# Patient Record
Sex: Male | Born: 1935 | Race: White | Hispanic: No | Marital: Married | Smoking: Former smoker
Health system: Southern US, Community
[De-identification: ages and names within clinical notes are randomized; demographics above are authoritative.]

## PROBLEM LIST (undated history)

## (undated) DIAGNOSIS — R413 Other amnesia: Secondary | ICD-10-CM

## (undated) DIAGNOSIS — I2510 Atherosclerotic heart disease of native coronary artery without angina pectoris: Secondary | ICD-10-CM

## (undated) DIAGNOSIS — I10 Essential (primary) hypertension: Secondary | ICD-10-CM

## (undated) DIAGNOSIS — D126 Benign neoplasm of colon, unspecified: Secondary | ICD-10-CM

## (undated) DIAGNOSIS — Z860100 Personal history of colon polyps, unspecified: Secondary | ICD-10-CM

## (undated) DIAGNOSIS — Z8546 Personal history of malignant neoplasm of prostate: Secondary | ICD-10-CM

## (undated) DIAGNOSIS — I6529 Occlusion and stenosis of unspecified carotid artery: Secondary | ICD-10-CM

## (undated) DIAGNOSIS — M1990 Unspecified osteoarthritis, unspecified site: Secondary | ICD-10-CM

## (undated) DIAGNOSIS — I739 Peripheral vascular disease, unspecified: Secondary | ICD-10-CM

## (undated) DIAGNOSIS — E119 Type 2 diabetes mellitus without complications: Secondary | ICD-10-CM

## (undated) DIAGNOSIS — E785 Hyperlipidemia, unspecified: Secondary | ICD-10-CM

## (undated) HISTORY — DX: Hyperlipidemia, unspecified: E78.5

## (undated) HISTORY — DX: Personal history of colon polyps, unspecified: Z86.0100

## (undated) HISTORY — DX: Other amnesia: R41.3

## (undated) HISTORY — DX: Personal history of malignant neoplasm of prostate: Z85.46

## (undated) HISTORY — DX: Peripheral vascular disease, unspecified: I73.9

## (undated) HISTORY — DX: Benign neoplasm of colon, unspecified: D12.6

## (undated) HISTORY — DX: Personal history of colonic polyps: Z86.010

## (undated) HISTORY — DX: Essential (primary) hypertension: I10

## (undated) HISTORY — DX: Occlusion and stenosis of unspecified carotid artery: I65.29

## (undated) HISTORY — DX: Type 2 diabetes mellitus without complications: E11.9

## (undated) HISTORY — DX: Unspecified osteoarthritis, unspecified site: M19.90

## (undated) HISTORY — DX: Atherosclerotic heart disease of native coronary artery without angina pectoris: I25.10

---

## 1998-04-09 ENCOUNTER — Encounter: Admission: RE | Admit: 1998-04-09 | Discharge: 1998-04-09 | Payer: Self-pay | Admitting: Family Medicine

## 1998-05-01 ENCOUNTER — Encounter: Admission: RE | Admit: 1998-05-01 | Discharge: 1998-05-01 | Payer: Self-pay | Admitting: Sports Medicine

## 1998-05-08 ENCOUNTER — Encounter: Admission: RE | Admit: 1998-05-08 | Discharge: 1998-05-08 | Payer: Self-pay | Admitting: Sports Medicine

## 1998-05-26 HISTORY — PX: CORONARY ARTERY BYPASS GRAFT: SHX141

## 1998-07-06 ENCOUNTER — Encounter: Admission: RE | Admit: 1998-07-06 | Discharge: 1998-07-06 | Payer: Self-pay | Admitting: Family Medicine

## 1998-08-21 ENCOUNTER — Encounter: Admission: RE | Admit: 1998-08-21 | Discharge: 1998-08-21 | Payer: Self-pay | Admitting: Family Medicine

## 1998-08-28 ENCOUNTER — Encounter: Admission: RE | Admit: 1998-08-28 | Discharge: 1998-08-28 | Payer: Self-pay | Admitting: Family Medicine

## 1998-10-12 ENCOUNTER — Encounter: Admission: RE | Admit: 1998-10-12 | Discharge: 1998-10-12 | Payer: Self-pay | Admitting: Family Medicine

## 1998-10-29 ENCOUNTER — Ambulatory Visit (HOSPITAL_COMMUNITY): Admission: RE | Admit: 1998-10-29 | Discharge: 1998-10-29 | Payer: Self-pay | Admitting: Family Medicine

## 1998-11-07 ENCOUNTER — Encounter: Payer: Self-pay | Admitting: *Deleted

## 1998-11-07 ENCOUNTER — Inpatient Hospital Stay (HOSPITAL_COMMUNITY): Admission: AD | Admit: 1998-11-07 | Discharge: 1998-11-13 | Payer: Self-pay | Admitting: *Deleted

## 1998-11-09 ENCOUNTER — Encounter: Payer: Self-pay | Admitting: Surgery

## 1998-11-10 ENCOUNTER — Encounter: Payer: Self-pay | Admitting: Surgery

## 1998-11-11 ENCOUNTER — Encounter: Payer: Self-pay | Admitting: Surgery

## 1998-11-14 ENCOUNTER — Encounter: Admission: RE | Admit: 1998-11-14 | Discharge: 1998-11-14 | Payer: Self-pay | Admitting: Family Medicine

## 1998-12-13 ENCOUNTER — Encounter: Admission: RE | Admit: 1998-12-13 | Discharge: 1998-12-13 | Payer: Self-pay | Admitting: Family Medicine

## 1998-12-25 ENCOUNTER — Encounter (HOSPITAL_COMMUNITY): Admission: RE | Admit: 1998-12-25 | Discharge: 1999-03-25 | Payer: Self-pay | Admitting: *Deleted

## 1999-01-23 ENCOUNTER — Encounter: Admission: RE | Admit: 1999-01-23 | Discharge: 1999-01-23 | Payer: Self-pay | Admitting: Family Medicine

## 1999-02-14 ENCOUNTER — Encounter: Admission: RE | Admit: 1999-02-14 | Discharge: 1999-02-14 | Payer: Self-pay | Admitting: Family Medicine

## 1999-03-22 ENCOUNTER — Encounter: Admission: RE | Admit: 1999-03-22 | Discharge: 1999-03-22 | Payer: Self-pay | Admitting: Sports Medicine

## 1999-06-17 ENCOUNTER — Encounter: Admission: RE | Admit: 1999-06-17 | Discharge: 1999-06-17 | Payer: Self-pay | Admitting: Family Medicine

## 1999-11-26 ENCOUNTER — Encounter: Admission: RE | Admit: 1999-11-26 | Discharge: 1999-11-26 | Payer: Self-pay | Admitting: Family Medicine

## 2000-01-14 ENCOUNTER — Encounter: Admission: RE | Admit: 2000-01-14 | Discharge: 2000-01-14 | Payer: Self-pay | Admitting: Sports Medicine

## 2000-01-21 ENCOUNTER — Encounter: Admission: RE | Admit: 2000-01-21 | Discharge: 2000-01-21 | Payer: Self-pay | Admitting: Sports Medicine

## 2000-03-24 ENCOUNTER — Encounter: Admission: RE | Admit: 2000-03-24 | Discharge: 2000-03-24 | Payer: Self-pay | Admitting: Family Medicine

## 2000-03-25 ENCOUNTER — Encounter: Admission: RE | Admit: 2000-03-25 | Discharge: 2000-03-25 | Payer: Self-pay | Admitting: Family Medicine

## 2000-04-14 ENCOUNTER — Encounter: Admission: RE | Admit: 2000-04-14 | Discharge: 2000-04-14 | Payer: Self-pay | Admitting: Family Medicine

## 2000-05-06 ENCOUNTER — Encounter: Admission: RE | Admit: 2000-05-06 | Discharge: 2000-05-06 | Payer: Self-pay | Admitting: Family Medicine

## 2000-06-02 ENCOUNTER — Encounter: Admission: RE | Admit: 2000-06-02 | Discharge: 2000-06-02 | Payer: Self-pay | Admitting: Family Medicine

## 2000-07-06 ENCOUNTER — Encounter: Admission: RE | Admit: 2000-07-06 | Discharge: 2000-07-06 | Payer: Self-pay | Admitting: Family Medicine

## 2000-10-05 ENCOUNTER — Encounter: Admission: RE | Admit: 2000-10-05 | Discharge: 2000-10-05 | Payer: Self-pay | Admitting: Family Medicine

## 2000-11-20 ENCOUNTER — Ambulatory Visit (HOSPITAL_COMMUNITY): Admission: RE | Admit: 2000-11-20 | Discharge: 2000-11-20 | Payer: Self-pay | Admitting: Neurology

## 2000-12-03 ENCOUNTER — Ambulatory Visit (HOSPITAL_COMMUNITY): Admission: RE | Admit: 2000-12-03 | Discharge: 2000-12-03 | Payer: Self-pay | Admitting: Neurology

## 2001-09-16 ENCOUNTER — Encounter: Admission: RE | Admit: 2001-09-16 | Discharge: 2001-09-16 | Payer: Self-pay | Admitting: Sports Medicine

## 2001-10-22 ENCOUNTER — Encounter: Admission: RE | Admit: 2001-10-22 | Discharge: 2001-10-22 | Payer: Self-pay | Admitting: Family Medicine

## 2002-03-04 ENCOUNTER — Encounter: Admission: RE | Admit: 2002-03-04 | Discharge: 2002-03-04 | Payer: Self-pay | Admitting: Family Medicine

## 2002-06-01 ENCOUNTER — Encounter: Admission: RE | Admit: 2002-06-01 | Discharge: 2002-06-01 | Payer: Self-pay | Admitting: Family Medicine

## 2002-07-05 ENCOUNTER — Encounter: Admission: RE | Admit: 2002-07-05 | Discharge: 2002-07-05 | Payer: Self-pay | Admitting: Family Medicine

## 2002-12-29 ENCOUNTER — Encounter: Admission: RE | Admit: 2002-12-29 | Discharge: 2002-12-29 | Payer: Self-pay | Admitting: Family Medicine

## 2003-01-25 ENCOUNTER — Encounter: Admission: RE | Admit: 2003-01-25 | Discharge: 2003-01-25 | Payer: Self-pay | Admitting: Family Medicine

## 2003-02-08 ENCOUNTER — Encounter: Admission: RE | Admit: 2003-02-08 | Discharge: 2003-02-08 | Payer: Self-pay | Admitting: Family Medicine

## 2003-03-17 ENCOUNTER — Encounter: Admission: RE | Admit: 2003-03-17 | Discharge: 2003-03-17 | Payer: Self-pay | Admitting: Sports Medicine

## 2003-04-14 ENCOUNTER — Encounter: Admission: RE | Admit: 2003-04-14 | Discharge: 2003-04-14 | Payer: Self-pay | Admitting: Family Medicine

## 2003-05-17 ENCOUNTER — Encounter: Admission: RE | Admit: 2003-05-17 | Discharge: 2003-05-17 | Payer: Self-pay | Admitting: Sports Medicine

## 2003-05-29 ENCOUNTER — Ambulatory Visit (HOSPITAL_COMMUNITY): Admission: RE | Admit: 2003-05-29 | Discharge: 2003-05-29 | Payer: Self-pay | Admitting: Family Medicine

## 2003-05-31 ENCOUNTER — Encounter: Admission: RE | Admit: 2003-05-31 | Discharge: 2003-05-31 | Payer: Self-pay | Admitting: Family Medicine

## 2003-10-06 ENCOUNTER — Encounter: Admission: RE | Admit: 2003-10-06 | Discharge: 2003-10-06 | Payer: Self-pay | Admitting: Family Medicine

## 2003-10-06 ENCOUNTER — Ambulatory Visit (HOSPITAL_COMMUNITY): Admission: RE | Admit: 2003-10-06 | Discharge: 2003-10-06 | Payer: Self-pay | Admitting: Family Medicine

## 2003-10-12 ENCOUNTER — Encounter: Admission: RE | Admit: 2003-10-12 | Discharge: 2003-10-12 | Payer: Self-pay | Admitting: Sports Medicine

## 2003-11-20 ENCOUNTER — Encounter: Admission: RE | Admit: 2003-11-20 | Discharge: 2003-11-20 | Payer: Self-pay | Admitting: Family Medicine

## 2004-06-05 ENCOUNTER — Ambulatory Visit: Payer: Self-pay | Admitting: Family Medicine

## 2004-10-28 ENCOUNTER — Ambulatory Visit: Payer: Self-pay | Admitting: *Deleted

## 2004-11-29 ENCOUNTER — Ambulatory Visit: Payer: Self-pay | Admitting: Family Medicine

## 2004-12-03 ENCOUNTER — Encounter: Admission: RE | Admit: 2004-12-03 | Discharge: 2004-12-03 | Payer: Self-pay | Admitting: Family Medicine

## 2005-03-12 ENCOUNTER — Ambulatory Visit: Payer: Self-pay | Admitting: Family Medicine

## 2005-05-27 ENCOUNTER — Ambulatory Visit: Payer: Self-pay | Admitting: Sports Medicine

## 2005-10-31 ENCOUNTER — Ambulatory Visit: Payer: Self-pay | Admitting: Internal Medicine

## 2005-11-11 ENCOUNTER — Ambulatory Visit: Payer: Self-pay | Admitting: Sports Medicine

## 2005-11-28 ENCOUNTER — Ambulatory Visit: Payer: Self-pay

## 2005-12-24 ENCOUNTER — Ambulatory Visit: Payer: Self-pay | Admitting: Family Medicine

## 2006-03-25 ENCOUNTER — Ambulatory Visit: Payer: Self-pay | Admitting: Internal Medicine

## 2006-04-20 ENCOUNTER — Ambulatory Visit: Payer: Self-pay | Admitting: Internal Medicine

## 2006-04-30 ENCOUNTER — Ambulatory Visit (HOSPITAL_COMMUNITY): Admission: RE | Admit: 2006-04-30 | Discharge: 2006-04-30 | Payer: Self-pay | Admitting: Urology

## 2006-05-04 ENCOUNTER — Ambulatory Visit: Payer: Self-pay | Admitting: Internal Medicine

## 2006-05-25 ENCOUNTER — Ambulatory Visit: Admission: RE | Admit: 2006-05-25 | Discharge: 2006-08-23 | Payer: Self-pay | Admitting: Radiation Oncology

## 2006-05-26 HISTORY — PX: INSERTION PROSTATE RADIATION SEED: SUR718

## 2006-06-03 ENCOUNTER — Ambulatory Visit: Payer: Self-pay | Admitting: Gastroenterology

## 2006-06-17 ENCOUNTER — Ambulatory Visit: Payer: Self-pay | Admitting: Gastroenterology

## 2006-06-17 ENCOUNTER — Encounter (INDEPENDENT_AMBULATORY_CARE_PROVIDER_SITE_OTHER): Payer: Self-pay | Admitting: Specialist

## 2006-07-23 DIAGNOSIS — M1990 Unspecified osteoarthritis, unspecified site: Secondary | ICD-10-CM

## 2006-07-23 DIAGNOSIS — H9190 Unspecified hearing loss, unspecified ear: Secondary | ICD-10-CM | POA: Insufficient documentation

## 2006-07-23 DIAGNOSIS — I10 Essential (primary) hypertension: Secondary | ICD-10-CM

## 2006-07-23 DIAGNOSIS — E7800 Pure hypercholesterolemia, unspecified: Secondary | ICD-10-CM | POA: Insufficient documentation

## 2006-07-23 DIAGNOSIS — I2510 Atherosclerotic heart disease of native coronary artery without angina pectoris: Secondary | ICD-10-CM

## 2006-08-24 ENCOUNTER — Ambulatory Visit: Admission: RE | Admit: 2006-08-24 | Discharge: 2006-11-14 | Payer: Self-pay | Admitting: Radiation Oncology

## 2006-09-17 ENCOUNTER — Encounter: Admission: RE | Admit: 2006-09-17 | Discharge: 2006-09-17 | Payer: Self-pay | Admitting: Urology

## 2006-09-24 ENCOUNTER — Ambulatory Visit (HOSPITAL_BASED_OUTPATIENT_CLINIC_OR_DEPARTMENT_OTHER): Admission: RE | Admit: 2006-09-24 | Discharge: 2006-09-24 | Payer: Self-pay | Admitting: Urology

## 2006-10-06 ENCOUNTER — Ambulatory Visit: Payer: Self-pay | Admitting: Cardiovascular Disease

## 2006-10-07 ENCOUNTER — Inpatient Hospital Stay (HOSPITAL_COMMUNITY): Admission: EM | Admit: 2006-10-07 | Discharge: 2006-10-08 | Payer: Self-pay | Admitting: Emergency Medicine

## 2006-10-12 ENCOUNTER — Ambulatory Visit: Payer: Self-pay | Admitting: Internal Medicine

## 2006-10-12 LAB — CONVERTED CEMR LAB
AST: 23 units/L (ref 0–37)
Albumin: 3.8 g/dL (ref 3.5–5.2)
Basophils Absolute: 0 10*3/uL (ref 0.0–0.1)
CO2: 30 meq/L (ref 19–32)
Creatinine, Ser: 1.4 mg/dL (ref 0.4–1.5)
Eosinophils Absolute: 0 10*3/uL (ref 0.0–0.6)
Eosinophils Relative: 1.4 % (ref 0.0–5.0)
GFR calc non Af Amer: 53 mL/min
MCHC: 34.5 g/dL (ref 30.0–36.0)
MCV: 90.8 fL (ref 78.0–100.0)
Monocytes Absolute: 0.3 10*3/uL (ref 0.2–0.7)
Monocytes Relative: 8.4 % (ref 3.0–11.0)
Sodium: 141 meq/L (ref 135–145)

## 2006-11-13 ENCOUNTER — Ambulatory Visit: Payer: Self-pay | Admitting: Internal Medicine

## 2006-11-20 ENCOUNTER — Encounter: Payer: Self-pay | Admitting: Internal Medicine

## 2006-11-20 ENCOUNTER — Ambulatory Visit: Payer: Self-pay | Admitting: Internal Medicine

## 2006-11-20 ENCOUNTER — Ambulatory Visit: Payer: Self-pay

## 2006-11-20 LAB — CONVERTED CEMR LAB
ALT: 22 units/L (ref 0–53)
AST: 21 units/L (ref 0–37)
Albumin: 3.7 g/dL (ref 3.5–5.2)
Alkaline Phosphatase: 88 units/L (ref 39–117)
BUN: 32 mg/dL — ABNORMAL HIGH (ref 6–23)
Calcium: 9.5 mg/dL (ref 8.4–10.5)
Chloride: 106 meq/L (ref 96–112)
Cholesterol: 170 mg/dL (ref 0–200)
Creatinine, Ser: 1.5 mg/dL (ref 0.4–1.5)
HDL: 35.6 mg/dL — ABNORMAL LOW (ref >39.0)
PSA: 0.01 ng/mL
Potassium: 4.5 meq/L (ref 3.5–5.1)
Total Protein: 7.4 g/dL (ref 6.0–8.3)
Triglycerides: 156 mg/dL — ABNORMAL HIGH (ref 0–149)

## 2007-02-04 ENCOUNTER — Encounter: Payer: Self-pay | Admitting: Internal Medicine

## 2007-02-04 DIAGNOSIS — I739 Peripheral vascular disease, unspecified: Secondary | ICD-10-CM

## 2007-02-04 DIAGNOSIS — Z860100 Personal history of colon polyps, unspecified: Secondary | ICD-10-CM | POA: Insufficient documentation

## 2007-02-04 DIAGNOSIS — I779 Disorder of arteries and arterioles, unspecified: Secondary | ICD-10-CM | POA: Insufficient documentation

## 2007-02-04 DIAGNOSIS — I10 Essential (primary) hypertension: Secondary | ICD-10-CM | POA: Insufficient documentation

## 2007-02-04 DIAGNOSIS — E785 Hyperlipidemia, unspecified: Secondary | ICD-10-CM | POA: Insufficient documentation

## 2007-02-04 DIAGNOSIS — Z8546 Personal history of malignant neoplasm of prostate: Secondary | ICD-10-CM | POA: Insufficient documentation

## 2007-02-04 DIAGNOSIS — M1990 Unspecified osteoarthritis, unspecified site: Secondary | ICD-10-CM | POA: Insufficient documentation

## 2007-04-08 ENCOUNTER — Encounter (INDEPENDENT_AMBULATORY_CARE_PROVIDER_SITE_OTHER): Payer: Self-pay | Admitting: Family Medicine

## 2007-04-08 ENCOUNTER — Ambulatory Visit: Payer: Self-pay | Admitting: Endocrinology

## 2007-04-08 DIAGNOSIS — E119 Type 2 diabetes mellitus without complications: Secondary | ICD-10-CM | POA: Insufficient documentation

## 2007-04-08 LAB — CONVERTED CEMR LAB
Cholesterol, target level: 200 mg/dL
HDL goal, serum: 40 mg/dL

## 2007-05-12 ENCOUNTER — Ambulatory Visit: Payer: Self-pay | Admitting: Internal Medicine

## 2007-05-12 LAB — CONVERTED CEMR LAB
Alkaline Phosphatase: 96 units/L (ref 39–117)
BUN: 16 mg/dL (ref 6–23)
CO2: 33 meq/L — ABNORMAL HIGH (ref 19–32)
Chloride: 104 meq/L (ref 96–112)
GFR calc Af Amer: 76 mL/min
GFR calc non Af Amer: 63 mL/min
Hgb A1c MFr Bld: 6.5 % — ABNORMAL HIGH (ref 4.6–6.0)
LDL Cholesterol: 49 mg/dL (ref 0–99)
Potassium: 4 meq/L (ref 3.5–5.1)
Sodium: 141 meq/L (ref 135–145)
Total CHOL/HDL Ratio: 3.1
Triglycerides: 122 mg/dL (ref 0–149)
VLDL: 24 mg/dL (ref 0–40)

## 2007-05-19 ENCOUNTER — Ambulatory Visit: Payer: Self-pay

## 2007-08-08 ENCOUNTER — Emergency Department (HOSPITAL_COMMUNITY): Admission: EM | Admit: 2007-08-08 | Discharge: 2007-08-08 | Payer: Self-pay | Admitting: Emergency Medicine

## 2007-10-05 ENCOUNTER — Ambulatory Visit: Payer: Self-pay | Admitting: Gastroenterology

## 2007-10-05 ENCOUNTER — Ambulatory Visit: Payer: Self-pay | Admitting: Endocrinology

## 2007-10-05 DIAGNOSIS — I9589 Other hypotension: Secondary | ICD-10-CM

## 2007-10-05 DIAGNOSIS — K5900 Constipation, unspecified: Secondary | ICD-10-CM | POA: Insufficient documentation

## 2007-10-05 LAB — CONVERTED CEMR LAB
BUN: 18 mg/dL (ref 6–23)
GFR calc non Af Amer: 57 mL/min
Potassium: 4.3 meq/L (ref 3.5–5.1)
T4, Total: 8.4 ug/dL (ref 5.0–12.5)
TSH: 2.41 microintl units/mL (ref 0.35–5.50)

## 2007-10-13 ENCOUNTER — Telehealth: Payer: Self-pay | Admitting: Gastroenterology

## 2007-10-13 ENCOUNTER — Ambulatory Visit: Payer: Self-pay | Admitting: Internal Medicine

## 2007-10-13 DIAGNOSIS — M6530 Trigger finger, unspecified finger: Secondary | ICD-10-CM | POA: Insufficient documentation

## 2007-10-22 ENCOUNTER — Encounter: Payer: Self-pay | Admitting: Internal Medicine

## 2007-11-02 ENCOUNTER — Ambulatory Visit: Payer: Self-pay

## 2007-11-02 ENCOUNTER — Ambulatory Visit: Payer: Self-pay | Admitting: Internal Medicine

## 2007-11-04 ENCOUNTER — Telehealth: Payer: Self-pay | Admitting: Gastroenterology

## 2007-11-05 ENCOUNTER — Telehealth: Payer: Self-pay | Admitting: Gastroenterology

## 2007-11-05 ENCOUNTER — Ambulatory Visit (HOSPITAL_COMMUNITY): Admission: RE | Admit: 2007-11-05 | Discharge: 2007-11-05 | Payer: Self-pay | Admitting: Gastroenterology

## 2007-11-15 ENCOUNTER — Ambulatory Visit (HOSPITAL_COMMUNITY): Admission: RE | Admit: 2007-11-15 | Discharge: 2007-11-15 | Payer: Self-pay | Admitting: Gastroenterology

## 2007-11-17 ENCOUNTER — Telehealth: Payer: Self-pay | Admitting: Gastroenterology

## 2008-01-04 ENCOUNTER — Encounter: Payer: Self-pay | Admitting: Gastroenterology

## 2008-10-24 ENCOUNTER — Ambulatory Visit: Payer: Self-pay | Admitting: Gastroenterology

## 2008-11-15 ENCOUNTER — Ambulatory Visit: Payer: Self-pay | Admitting: Internal Medicine

## 2008-11-15 DIAGNOSIS — I6529 Occlusion and stenosis of unspecified carotid artery: Secondary | ICD-10-CM

## 2009-01-01 ENCOUNTER — Encounter: Payer: Self-pay | Admitting: Internal Medicine

## 2009-02-07 ENCOUNTER — Telehealth: Payer: Self-pay | Admitting: Gastroenterology

## 2009-03-01 ENCOUNTER — Encounter: Payer: Self-pay | Admitting: Gastroenterology

## 2009-05-08 ENCOUNTER — Encounter (INDEPENDENT_AMBULATORY_CARE_PROVIDER_SITE_OTHER): Payer: Self-pay | Admitting: *Deleted

## 2009-06-18 ENCOUNTER — Encounter (INDEPENDENT_AMBULATORY_CARE_PROVIDER_SITE_OTHER): Payer: Self-pay | Admitting: *Deleted

## 2009-06-20 ENCOUNTER — Encounter (INDEPENDENT_AMBULATORY_CARE_PROVIDER_SITE_OTHER): Payer: Self-pay | Admitting: *Deleted

## 2009-06-21 ENCOUNTER — Ambulatory Visit: Payer: Self-pay | Admitting: Gastroenterology

## 2009-06-27 ENCOUNTER — Ambulatory Visit: Payer: Self-pay | Admitting: Gastroenterology

## 2009-08-16 ENCOUNTER — Encounter: Payer: Self-pay | Admitting: Internal Medicine

## 2009-08-17 ENCOUNTER — Telehealth (INDEPENDENT_AMBULATORY_CARE_PROVIDER_SITE_OTHER): Payer: Self-pay | Admitting: *Deleted

## 2009-08-17 ENCOUNTER — Encounter: Payer: Self-pay | Admitting: Internal Medicine

## 2009-08-20 ENCOUNTER — Telehealth (INDEPENDENT_AMBULATORY_CARE_PROVIDER_SITE_OTHER): Payer: Self-pay | Admitting: *Deleted

## 2009-08-23 ENCOUNTER — Telehealth: Payer: Self-pay | Admitting: Internal Medicine

## 2009-08-28 ENCOUNTER — Ambulatory Visit: Payer: Self-pay | Admitting: Internal Medicine

## 2009-11-08 ENCOUNTER — Encounter: Admission: RE | Admit: 2009-11-08 | Discharge: 2009-11-08 | Payer: Self-pay | Admitting: Urology

## 2010-06-25 NOTE — Progress Notes (Signed)
Summary: QUESTIONS ABOUT RECORDS   Phone Note Call from Patient Call back at Home Phone [phone]   Caller: Patient Summary of Call: PT [facility] REGARDING HIS RECORDS Initial call taken by: Judie Grieve,  August 23, 2009 10:37 AM  Follow-up for Phone Call        spoke w/pt he is upset b/c he had a full physical w/ekg and carotid u/s at pcp Dr Clarene Duke at [facility] and they have faxed records twice and we have him we do not have them, will look for records and call pt back Meredith Staggers, RN  August 23, 2009 2:04 PM   I have pts records he is aware Meredith Staggers, RN  August 23, 2009 2:15 PM

## 2010-06-25 NOTE — Miscellaneous (Signed)
Summary: [facility] PV  Clinical Lists Changes  Medications: Added new medication of MOVIPREP 100 GM  SOLR (PEG-KCL-NACL-NASULF-NA ASC-C) As per prep instructions. - Signed Rx of MOVIPREP 100 GM  SOLR (PEG-KCL-NACL-NASULF-NA ASC-C) As per prep instructions.;  #1 x 0;  Signed;  Entered by: Karl Bales RN;  Authorized by: Mardella Layman MD [facility];  Method used: Electronically to [facility]*, [address], [place], [place]  [address], Ph: [phone] or [phone], Fax: [phone]    Prescriptions: MOVIPREP 100 GM  SOLR (PEG-KCL-NACL-NASULF-NA ASC-C) As per prep instructions.  #1 x 0   Entered by:   Karl Bales RN   Authorized by:   Mardella Layman MD [facility]   Signed by:   Karl Bales RN on 06/21/2009   Method used:   Electronically to        [facility]* (retail)       [address] [place], [place]  [address]       Ph: [phone] or [phone]       Fax: [phone]   RxID:   [id number]

## 2010-06-25 NOTE — Letter (Signed)
Summary: Previsit letter  [facility]  [address] [place], [place] [address]   Phone: [phone]  Fax: [phone]       06/18/2009 MRN: [id number]  Allen Anderson [address] [place], [place]  [address]  Dear Mr. CHEEVER,  Welcome to the Gastroenterology Division at [facility].    You are scheduled to see a nurse for your pre-procedure visit on 06-21-09 at 2:00a.m. on the 3rd floor at [facility], [address]  We ask that you try to arrive at our office 15 minutes prior to your appointment time to allow for check-in.  Your nurse visit will consist of discussing your medical and surgical history, your immediate family medical history, and your medications.    Please bring a complete list of all your medications or, if you prefer, bring the medication bottles and we will list them.  We will need to be aware of both prescribed and over the counter drugs.  We will need to know exact dosage information as well.  If you are on blood thinners (Coumadin, Plavix, Aggrenox, Ticlid, etc.) please call our office today/prior to your appointment, as we need to consult with your physician about holding your medication.   Please be prepared to read and sign documents such as consent forms, a financial agreement, and acknowledgement forms.  If necessary, and with your consent, a friend or relative is welcome to sit-in on the nurse visit with you.  Please bring your insurance card so that we may make a copy of it.  If your insurance requires a referral to see a specialist, please bring your referral form from your primary care physician.  No co-pay is required for this nurse visit.     If you cannot keep your appointment, please call [phone] to cancel or reschedule prior to your appointment date.  This allows [place] the opportunity to schedule an appointment for another patient in need of care.    Thank you for choosing [facility] for your medical  needs.  We appreciate the opportunity to care for you.  Please visit [place] at our website  to learn more about our practice.                     Sincerely.                                                                                                                   The Gastroenterology Division

## 2010-06-25 NOTE — Procedures (Signed)
Summary: Colonoscopy  Patient: Allen Anderson Note: All result statuses are Final unless otherwise noted.  Tests: (1) Colonoscopy (COL)   COL Colonoscopy           DONE     [facility]     [address]     [place], [place]  [address]           COLONOSCOPY PROCEDURE REPORT           PATIENT:  Allen Anderson, Allen Anderson  MR#:  [id number]     BIRTHDATE:  March 31, 1929, 80 yrs. old  GENDER:  male           ENDOSCOPIST:  Vania Rea. Jarold Motto, MD, [facility]     Referred by:           PROCEDURE DATE:  06/27/2009     PROCEDURE:  Colonoscopy, Diagnostic     ASA CLASS:  Class II     INDICATIONS:  constipation, history of pre-cancerous (adenomatous)     colon polyps           MEDICATIONS:   Fentanyl 50 mcg IV, Versed 5 mg IV           DESCRIPTION OF PROCEDURE:   After the risks benefits and     alternatives of the procedure were thoroughly explained, informed     consent was obtained.  Digital rectal exam was performed and     revealed no abnormalities.   The LB CF-[id number] endoscope     was introduced through the anus and advanced to the cecum, which     was identified by both the appendix and ileocecal valve, without     limitations.  The quality of the prep was poor, using MoviPrep.     The instrument was then slowly withdrawn as the colon was fully     examined.     <<PROCEDUREIMAGES>>           FINDINGS:  Internal hemorrhoids were found in the rectum.     Retroflexed views in the rectum revealed hemorrhoids.    The scope     was then withdrawn from the patient and the procedure completed.           COMPLICATIONS:  None           ENDOSCOPIC IMPRESSION:     1) Internal hemorrhoids in the rectum     2) Hemorrhoids     NO RECTAL POLYPS NOTED.NO RADIATION DAMAGE.     RECOMMENDATIONS:     1) Follow up colonoscopy in 5 years     2) continue current medications           REPEAT EXAM:  No           ______________________________     Vania Rea. Jarold Motto, MD, [facility] Graham           CC:  Aida Puffer, MD           n.     Rosalie DoctorMarland Kitchen   Vania Rea. Donnesha Karg at 06/27/2009 02:25 PM           Clovis Riley, [id number]  Note: An exclamation mark (!) indicates a result that was not dispersed into the flowsheet. Document Creation Date: 06/27/2009 2:25 PM _______________________________________________________________________  (1) Order result status: Final Collection or observation date-time: 06/27/2009 14:17 Requested date-time:  Receipt date-time:  Reported date-time:  Referring Physician:   Ordering Physician: Sheryn Bison [phone]) Specimen Source:  Source: Launa Grill Order Number: [id number] Lab site:  Appended Document: Colonoscopy    Clinical Lists Changes  Observations: Added new observation of COLONNXTDUE: 06/2014 (06/27/2009 15:13)      Appended Document: Colonoscopy     Procedures Next Due Date:    Colonoscopy: 06/2014

## 2010-06-25 NOTE — Letter (Signed)
Summary: [facility] Office Note  [facility] Office Note   Imported By: Roderic Ovens 09/06/2009 15:37:06  _____________________________________________________________________  External Attachment:    Type:   Image     Comment:   External Document

## 2010-06-25 NOTE — Progress Notes (Signed)
   Faxed all Cardiac over to [facility] to fax [phone] [facility]  August 20, 2009 1:57 PM

## 2010-06-25 NOTE — Letter (Signed)
Summary: [facility] Instructions  [facility]  [address] [place], [place] [address]   Phone: [phone]  Fax: [phone]       Allen Anderson    [birth year]    MRN: [id number]        Procedure Day Dorna Bloom:  [facility]  06/27/09     Arrival Time:  1:30PM     Procedure Time:  2:30PM     Location of Procedure:                    _ X_  [facility] (4th Floor)                        PREPARATION FOR COLONOSCOPY WITH MOVIPREP   Starting 5 days prior to your procedure 06/22/09 do not eat nuts, seeds, popcorn, corn, beans, peas,  salads, or any raw vegetables.  Do not take any fiber supplements (e.g. Metamucil, Citrucel, and Benefiber).  THE DAY BEFORE YOUR PROCEDURE         DATE: 06/26/09  DAY: WEDNESDAY  1.  Drink clear liquids the entire day-NO SOLID FOOD  2.  Do not drink anything colored red or purple.  Avoid juices with pulp.  No orange juice.  3.  Drink at least 64 oz. (8 glasses) of fluid/clear liquids during the day to prevent dehydration and help the prep work efficiently.  CLEAR LIQUIDS INCLUDE: Water Jello Ice Popsicles Tea (sugar ok, no milk/cream) Powdered fruit flavored drinks Coffee (sugar ok, no milk/cream) Gatorade Juice: apple, white grape, white cranberry  Lemonade Clear bullion, consomm, broth Carbonated beverages (any kind) Strained chicken noodle soup Hard Candy                             4.  In the morning, mix first dose of MoviPrep solution:    Empty 1 Pouch A and 1 Pouch B into the disposable container    Add lukewarm drinking water to the top line of the container. Mix to dissolve    Refrigerate (mixed solution should be used within 24 hrs)  5.  Begin drinking the prep at 5:00 p.m. The MoviPrep container is divided by 4 marks.   Every 15 minutes drink the solution down to the next mark (approximately 8 oz) until the full liter is complete.   6.  Follow completed prep with 16 oz of clear liquid of your choice  (Nothing red or purple).  Continue to drink clear liquids until bedtime.  7.  Before going to bed, mix second dose of MoviPrep solution:    Empty 1 Pouch A and 1 Pouch B into the disposable container    Add lukewarm drinking water to the top line of the container. Mix to dissolve    Refrigerate  THE DAY OF YOUR PROCEDURE      DATE: 06/27/09 DAY: WEDNESDAY  Beginning at 9:30AM (5 hours before procedure):         1. Every 15 minutes, drink the solution down to the next mark (approx 8 oz) until the full liter is complete.  2. Follow completed prep with 16 oz. of clear liquid of your choice.    3. You may drink clear liquids until 12:30PM (2 HOURS BEFORE PROCEDURE).   MEDICATION INSTRUCTIONS  Unless otherwise instructed, you should take regular prescription medications with a small sip of water   as early as possible the morning of  your procedure.         OTHER INSTRUCTIONS  You will need a responsible adult at least 75 years of age to accompany you and drive you home.   This person must remain in the waiting room during your procedure.  Wear loose fitting clothing that is easily removed.  Leave jewelry and other valuables at home.  However, you may wish to bring a book to read or  an iPod/MP3 player to listen to music as you wait for your procedure to start.  Remove all body piercing jewelry and leave at home.  Total time from sign-in until discharge is approximately 2-3 hours.  You should go home directly after your procedure and rest.  You can resume normal activities the  day after your procedure.  The day of your procedure you should not:   Drive   Make legal decisions   Operate machinery   Drink alcohol   Return to work  You will receive specific instructions about eating, activities and medications before you leave.    The above instructions have been reviewed and explained to me by   Karl Bales RN  June 21, 2009 2:20 PM    I fully  understand and can verbalize these instructions _____________________________ Date _________

## 2010-06-25 NOTE — Progress Notes (Signed)
Summary: Med List  Med List   Imported By: Roderic Ovens 09/06/2009 15:36:21  _____________________________________________________________________  External Attachment:    Type:   Image     Comment:   External Document

## 2010-06-25 NOTE — Assessment & Plan Note (Signed)
Summary: rov/kfw  Medications Added LIPITOR 20 MG TABS (ATORVASTATIN CALCIUM) Take one tablet by mouth daily. MIRALAX   POWD (POLYETHYLENE GLYCOL 3350) mix one scoop in water two times a day as needed PLAVIX 75 MG TABS (CLOPIDOGREL BISULFATE) Take one tablet by mouth daily      Allergies Added:   Visit Type:  Follow-up Primary Provider:  Aida Puffer, MD  CC:  no complaints.  History of Present Illness: Allen Anderson is a 77 year old male with a history of coronary artery disease, status post bypass surgery in 2000.  He also has mild-to-moderate carotid stenosis, hypertension, hyperlipidemia and prostate cancer.  He had a cardiac catheterization in May 2008 which showed stable   Doing well from a cardiac perspective. No CP or SOB. Still mowing the grass. Had episode recently where he jumped up quickly and got lightheaded and fuzzy and a little confused. resolved in 10-15 mins.. No orthopnea, PND or edema. Had repeat carotids. L ICA <50% stable R ICA fine. Started on Plavix for possible TIA.  Doing well with his meds and watching diet.  REcent lipids TC 143 TG 106 HDL 46 LDL 76   Current Medications (verified): 1)  Lipitor 20 Mg Tabs (Atorvastatin Calcium) .... Take One Tablet By Mouth Daily. 2)  Flomax 0.4 Mg  Cp24 (Tamsulosin Hcl) .... Take 1 Tablet By Mouth Once A Day 3)  Glucosamine-Msm (Unknown Dosage) .... Take 1 Tablet By Mouth Once A Day 4)  Fish Oil   Oil (Fish Oil) .... 1000mg  Two Times A Day 5)  Adult Aspirin Ec Low Strength 81 Mg  Tbec (Aspirin) .... Take 1 By Mouth Qd 6)  Calcium 1,000 Mg .... Take 1 Tablet By Mouth Once A Day 7)  Amitiza 8 Mcg Caps (Lubiprostone) .Marland Kitchen.. 1 By Mouth Once Daily 8)  Benefiber  Powd (Wheat Dextrin) .... Take 2 Teaspoons Daily 9)  Avodart 0.5 Mg Caps (Dutasteride) .... Take One Tablet By Mouth Once Daily. 10)  Miralax   Powd (Polyethylene Glycol 3350) .... Mix One Scoop in Water Two Times A Day As Needed 11)  Plavix 75 Mg Tabs (Clopidogrel  Bisulfate) .... Take One Tablet By Mouth Daily  Allergies (verified): 1)  ! Beta Blockers  Past History:  Past Medical History: Coronary artery disease    --cath may 2008. stable  Carotid stenosis    --6/10: R 0-39% L 40-59%     --07/2009 ([facility]) : L ICA < 50% L ECA high-grade  R mild plaque Hyperlipidemia Hypertension Osteoarthritis Hearing Loss Peripheral vascular disease/carotid disease - due f/u 12/09 Prostate cancer, hx of Colonic polyps, hx of tubular adenoma Diabetes mellitus, type II - diet  Review of Systems       As per HPI and past medical history; otherwise all systems negative.   Vital Signs:  Patient profile:   77 year old male Height:      70 inches Weight:      189 pounds BMI:     27.22 Pulse rate:   65 / minute BP sitting:   122 / 68  (left arm) Cuff size:   regular  Vitals Entered By: Hardin Negus, RMA (August 28, 2009 2:00 PM)  Physical Exam  General:  Gen: well appearing. no resp difficulty HEENT: normal Neck: supple. no JVD. Carotids 2+ with bilat bruits. No lymphadenopathy or thryomegaly appreciated. Cor: PMI nondisplaced. Regular rate & rhythm. No rubs, murmur. +s4 Lungs: clear Abdomen: soft, nontender, nondistended. No hepatosplenomegaly. No bruits or masses. Good  bowel sounds. Extremities: no cyanosis, clubbing, rash, edema Neuro: alert & orientedx3, cranial nerves grossly intact. moves all 4 extremities w/o difficulty. affect pleasant    Impression & Recommendations:  Problem # 1:  CORONARY ARTERY DISEASE (ICD-414.00) Stable. No evidence of ischemia. Continue current regimen.  Problem # 2:  HYPERTENSION (ICD-401.9) Blood pressure well controlled. Continue current regimen.  Problem # 3:  HYPERCHOLESTEROLEMIA (ICD-272.0) Lipids look good. Continue current regimen.  Problem # 4:  CAROTID STENOSIS (ICD-433.10) Suspect signs due more to orthostasis and not TIA. I reviewed carotid u/s and no high-grade blockage.   Patient  Instructions: 1)  Follow up in 1 year

## 2010-06-25 NOTE — Progress Notes (Signed)
   Faxed Doppler over to [facility] w/ [facility] to fax [phone] [facility]  August 17, 2009 12:26 PM

## 2010-08-01 ENCOUNTER — Encounter (INDEPENDENT_AMBULATORY_CARE_PROVIDER_SITE_OTHER): Payer: Self-pay | Admitting: *Deleted

## 2010-08-06 NOTE — Letter (Signed)
Summary: Appointment - Reschedule  [facility], Main Office  [address]   [place], [place] [address]   Phone: [phone]  Fax: [phone]     August 01, 2010 MRN: [id number]   LIVAN HIRES [address] [place], [place]  [address]   Dear Mr. BROAD,   Due to a change in our office schedule, your appointment on  Apirl 11,2012 at 10:00 must be changed.  It is very important that we reach you to reschedule this appointment. We look forward to participating in your health care needs. Please contact us at the number listed above at your earliest convenience to reschedule this appointment.     Sincerely, Control and instrumentation engineer

## 2010-09-04 ENCOUNTER — Ambulatory Visit: Payer: Self-pay | Admitting: Internal Medicine

## 2010-10-08 NOTE — Assessment & Plan Note (Signed)
[name]                            CARDIOLOGY OFFICE NOTE   Allen Anderson, Allen Anderson                      MRN:          [id number]  DATE:11/02/2007                            DOB:          09/27/1928    INTERVAL HISTORY:  Allen Anderson is a 77 year old male with a history of  coronary artery disease, status post bypass surgery in 2000.  He also  has mild-to-moderate carotid stenosis, hypertension, hyperlipidemia and  prostate cancer.  He had a cardiac catheterization in May 2008 which  showed stable revascularization.   He returns today for routine followup.  He says that he has been having  problems with constipation.  He feels like it is all stuck up inside him  and it will not flow down to his distal colon.  He feels this is putting  pressure on his heart.  He has seen his primary care doctor and also  seen his gastroenterologist since he has been started on MiraLax, but he  says he does not feel that this has helped much.   He denies any chest pain.  He says he does get dizzy if he stands up too  quickly.  He feels that his blood pressure runs a little bit low at  times, occasionally in the 90/50 range, but he checks it quite  frequently at home and it is usually 120/60.  He notes that he feels his  heart pounding if he does significant exercise, but does not have any  resting palpitations.  He continues to undergo treatment for his  prostate cancer.   CURRENT MEDICATIONS:  Altace 5 a day, Omega 3 is b.i.d., vitamin C,  calcium, aspirin 81, Lipitor 40 a day, Flomax 0.4 a day, MiraLax,  Amitiza 8 mcg b.i.d., and glucosamine/chondroitin.   PHYSICAL EXAM:  He is in no acute distress.  Ambulates around the clinic  without any respiratory difficulty.  Blood pressure is initially 124/62.  On followup, 138/64.  Weight is  195, heart rate is 63.  HEENT:  Normal.  Neck is supple.  No JVD.  Carotids are 2+ bilaterally.  There are bilateral bruits.   There is no lymphadenopathy or thyromegaly.  CARDIAC:  PMI is nondisplaced.  Irregular rate rhythm with an S4.  No  murmurs or rubs.  LUNGS:  Clear.  ABDOMEN:  Obese, nontender, nondistended.  No hepatosplenomegaly, no  bruits, no masses.  Good bowel sounds.  EXTREMITIES:  Warm with no cyanosis, clubbing or edema.  NEURO:  Alert and oriented x3.  Cranial nerves II-XII intact.  Moves all  4 extremities without difficulty.  Affect is a bit odd.   ASSESSMENT/PLAN:  1. Coronary artery disease.  This is stable.  No evidence of ischemia.      Continue current therapy.  2. Hypertension.  Blood pressure looks good.  He appears to be having      occasional bouts of borderline hypotension, but I think in general      we can keep his medications where they are.  3. Hyperlipidemia.  This followed by his primary care physician, Dr.  John.  LDL is less than 70.  4. Carotid stenosis.  He is due for his follow-up ultrasound today.   DISPOSITION:  I will see him back in 9 months to a year for routine  followup.     Bevelyn Buckles. Bensimhon, MD  Electronically Signed    DRB/MedQ  DD: 11/02/2007  DT: 11/02/2007  Job #: [id number]

## 2010-10-08 NOTE — H&P (Signed)
Allen Anderson, Allen [name]:  [id number]   MEDICAL RECORD NO.:  [id number]          PATIENT TYPE:  INP   LOCATION:  5523                         FACILITY:  [facility]   PHYSICIAN:  Brayton El, MD    DATE OF BIRTH:  July 18, 1928   DATE OF ADMISSION:  10/06/2006  DATE OF DISCHARGE:                              HISTORY & PHYSICAL   ADDENDUM:  The patient will not be placed on full anticoagulation at  this time as he has had recent prostate surgery.  In the event the  patient were to rule in for a non-ST-segment elevation myocardial  infarction, we would start anticoagulation at that time.      Brayton El, MD  Electronically Signed     SGA/MEDQ  D:  10/07/2006  T:  10/07/2006  Job:  [id number]

## 2010-10-08 NOTE — Assessment & Plan Note (Signed)
Allen Anderson                            CARDIOLOGY OFFICE NOTE   Allen Anderson, Allen Anderson                      MRN:          [id number]  DATE:05/12/2007                            DOB:          Feb 11, 1929    PRIMARY CARE PHYSICIAN:  Sean A. Everardo All, M.D.   INTERVAL HISTORY:  Allen Anderson is a 77 year old male with a history of  coronary artery disease, status post bypass surgery in 2000 as well as  carotid stenosis, hypertension, hyperlipidemia, and prostate cancer.  He  returns for routine followup.  He had a cardiac catheterization in May  which showed stable revascularization.  He says he is feeling well from  a cardiac point of view.  He denies any chest pain or shortness of  breath.  He does feel like his treatment for his prostate cancer has  really weakened him.  He says after walking about 100 feet, his legs get  tired, he has to stop, then he can start up again.  He denies frank  claudication.  No rest pain.  He has had problems with mild lower  extremity edema.  He is previously on HCTZ but stopped this because he  said it made him urinate too much.   CURRENT MEDICATIONS:  1. Altace 5 mg a day.  2. Vitamin C.  3. Omega 3.  4. Aspirin 81.  5. Lipitor 40 a day.  6. Flomax 0.4 a day.   MEDICATION INTOLERANCES:  He is intolerant to BETA BLOCKERS due to  bradycardia.   PHYSICAL EXAMINATION:  He is ambulates around the room without any  respiratory difficulty.  Blood pressure is 180/72.  This was checked twice manually.  HEENT:  Normal.  NECK:  Supple.  There is no JVD.  Carotids are 2+ bilaterally.  He has  bilateral carotid bruits.  No lymphadenopathy or thyromegaly.  CARDIAC:  PMI is normal.  He has a regular rate and rhythm with an S4.  No murmur.  LUNGS:  Clear.  ABDOMEN:  Obese, nontender, nondistended.  No hepatosplenomegaly.  No  bruits.  No masses.  Good bowel sounds.  EXTREMITIES:  Warm with no clubbing, cyanosis or edema.  His DP  pulses  are 2+ on the right and 1+ on the left.  There is no ulceration.  NEURO:  He is alert and oriented x3.  Cranial nerves II-XII are intact.  Moves all four extremities without difficulty.  Affect is somewhat odd.   ASSESSMENT/PLAN:  1. Coronary artery disease that is quite stable.  No evidence of      ongoing ischemia.  Continue current therapy.  2. Hypertension:  Blood pressure is markedly elevated today.  He says      that he does not believe this, and his blood pressures are always      well controlled.  He is reluctant to start any further medications      for this at this point.  I have asked him to keep a blood pressure      log and follow up with Dr. Everardo All.  3. Slight fatigue:  I  am wondering if he has peripheral vascular      disease.  He does have diminished pulses as well as a femoral      bruit.  We will check ABIs and lower extremity ultrasound.  4. Hyperlipidemia:  I will check lipids today, continue Lipitor.  Goal      LDL is less than 70.  5. Carotid artery stenosis.  The last study showed 40-59% on the      right, 60-79% on the left.  He is due for a followup in one year.   DISPOSITION:  He is stable from a cardiac perspective.  He will follow  up with Dr. Everardo All in the near future for further evaluation of his  hypertension.  We will see him back in one year for routine followup.     Bevelyn Buckles. Bensimhon, MD  Electronically Signed    DRB/MedQ  DD: 05/12/2007  DT: 05/13/2007  Job #: [id number]

## 2010-10-08 NOTE — Discharge Summary (Signed)
Allen Anderson, Allen [name]:  [id number]   MEDICAL RECORD NO.:  [id number]          PATIENT TYPE:  INP   LOCATION:  5523                         FACILITY:  [facility]   PHYSICIAN:  Bevelyn Buckles. Bensimhon, MDDATE OF BIRTH:  [birth year]   DATE OF ADMISSION:  10/06/2006  DATE OF DISCHARGE:  10/08/2006                               DISCHARGE SUMMARY   ADDENDUM   Patient's CT scan of the head which was completed secondary to severe  headache was found to be negative for blood clot, bleed or any  abnormalities.  The patient was discharged home with medications as  described above and followup appointment with Dr. Gala Romney.      Bettey Mare. Lyman Bishop, NP      Bevelyn Buckles. Bensimhon, MD  Electronically Signed    KML/MEDQ  D:  10/08/2006  T:  10/08/2006  Job:  [id number]   cc:   Primary Care Physician

## 2010-10-08 NOTE — Assessment & Plan Note (Signed)
[facility]                                 ON-CALL NOTE   LENY, MOROZOV                      MRN:          [id number]  DATE:10/07/2007                            DOB:          12/03/1928    TELEPHONE CONTACT NOTE:  Dated Oct 07, 2007.   PRIMARY CARDIOLOGIST:  Bevelyn Buckles. Bensimhon, MD.   PRIMARY CARE PHYSICIAN:  Dr. Oliver Barre   ENDOCRINOLOGIST:  Dr. Romero Belling   UROLOGIST:  Dr. Earlene Plater   ONCOLOGISTS:  Believe is Dr. Myna Hidalgo   Mr. Harmes called because he stated he had been to three doctors this  week and he was told his systolic blood pressure was in the 90s and he  was told to stop his Altace. He stated he did not wish to do that  without talking to [place].  I advised him to stop the Altace and we would  have him brought in as soon as possible.  I have left a message with the  office.  He indicated understanding that he was not supposed to take the  Altace.  I also reinforced the fact that he is supposed to have stopped  his hydrochlorothiazide, which he stated he did.  He is apparently a bit  symptomatic with this and complains of some orthostatic dizziness and  spots when he stands up.  He is to call us back if his symptoms get  any worse or call 9-1-1.      Theodore Demark, PA-C  Electronically Signed      Rollene Rotunda, MD, [facility]  Electronically Signed   RB/MedQ  DD: 10/07/2007  DT: 10/07/2007  Job #: [id number]

## 2010-10-08 NOTE — Cardiovascular Report (Signed)
Allen Anderson, [name]:  [id number]   MEDICAL RECORD NO.:  [id number]          PATIENT TYPE:  INP   LOCATION:  5523                         FACILITY:  [facility]   PHYSICIAN:  [name], MD DATE OF BIRTH:  May 02, 1929   DATE OF PROCEDURE:  DATE OF DISCHARGE:                            CARDIAC CATHETERIZATION   PROCEDURE:  Cardiac catheterization.   REQUESTING CARDIOLOGIST:  Bevelyn Buckles. Bensimhon, MD.   PRIMARY CARE PHYSICIAN:  Corwin Levins, MD   INDICATIONS:  Allen Anderson is a 77 year old male with a history of  coronary artery disease status post coronary artery bypass grafting  in 2000 by Dr. Laneta Simmers, including a LIMA to the left anterior descending,  saphenous vein graft to the obtuse marginal, and a saphenous vein graft  to the posterior descending branch of the right coronary artery.  He had  an adenosine Myoview last July indicating no evidence of ischemia with  an ejection fraction of 69%.  He has recently undergone treatment for  prostate cancer, including radiation treatments as well as  brachytherapy.  Additional problems include hypertension,  hyperlipidemia, and moderate carotid artery disease.  He is now admitted  to the hospital having presented with chest pain and symptoms concerning  for potential unstable angina.  His cardiac markers were normal.  He is  referred, now, for a diagnostic cardiac catheterization to assess graft  patency and coronary anatomy with an eye towards potential  revascularization strategies.  The risks and benefits were explained to  the patient in advance, and informed consent was obtained.   PROCEDURE PERFORMED:  1. Left heart catheterization.  2. Selective coronary geography.  3. Selective bypass graft angiography.  4. Left ventriculography.   ACCESS AND EQUIPMENT:  The area about the right femoral artery was  anesthetized with 1% lidocaine and a 6-French sheath was placed in the  right femoral artery via the  modified Seldinger technique.  A standard  preformed 6-French JL-4 and JR-4 catheters were used for selective  coronary angiography and bypass graft angiography.  An angled pigtail  catheter was used for left heart catheterization and left  ventriculography.  Exchanges were made over the wire, and the patient  tolerated the procedure well without immediate complications.   HEMODYNAMICS:  Aorta 165/69 mmHg, left ventricle 160/20 mmHg.   ANGIOGRAPHIC FINDINGS:  1. The left main coronary artery is small in caliber and gives rise to      a left anterior descending and circumflex vessels.  There is focal      stenosis at approximately 60-70% in the distal left main.  2. The left anterior descending is a medium caliber vessel with two      diagonal branches.  There is mild atherosclerosis noted with      luminal irregularities and approximately 30% stenosis in the      proximal-to-mid vessel segment.  On antegrade injections of the      left anterior descending, there is TIMI 3 flow and no significant      competitive flow noted from the left internal mammary artery graft.  3. The  circumflex coronary artery is diseased at approximately 50% in      diffuse fashion in the midvessel segment.  There is competitive      flow noted in the distal vessel including the obtuse marginal      system.  4. The right coronary artery is small in caliber and diffusely disease      including approximately 70% diffuse stenosis in the mid-to-distal      vessel culminating in 80% stenosis before the takeoff of the      posterior descending branch.  5. The saphenous vein graft to the posterior descending branch of the      right coronary is widely patent.  6. The saphenous vein graft to the obtuse marginal system is widely      patent.  7. The left internal mammary artery is selectively injected and noted      to be significantly atretic.  The vessel branches; and these are      very small branches into the  vicinity of the surgical course and      staples near the left anterior descending, although there is no      clear conduit through to the left anterior descending.   LEFT VENTRICULOGRAPHY:  Left ventriculography was performed in the RAO  projection.  It reveals an ejection fraction of approximately 65-70%  with no focal wall motion abnormality and 1+ mitral regurgitation.   DIAGNOSES:  1. Coronary artery disease as outlined above including approximately      60-70% distal left main stenosis.  The saphenous vein graft to the      obtuse marginal system is patent, saphenous vein graft to the      posterior descending branch is patent, and the left internal      mammary artery graft is significantly atretic and does not reach      the left anterior descending.  Antegrade flow within the left      anterior descending is TIMI 3.  2. Left ventricular ejection fraction of 65-70% with no focal wall      motion abnormality; and a left ventricular end-diastolic pressure      of 20 mmHg with 1+ mitral regurgitation.   I reviewed the results with the patient, his family, and with Dr.  Gala Romney.  It is certainly possible that the patient's LIMA graft has  been atretic for some time now.  His cardiac markers are normal arguing  against an acute closure event.  He does have reasonable flow within the  native left anterior descending.  There is residual 60-70% stenosis in  the distal left main coronary artery, although it does not seem likely  that this would be causing rest chest pain of sudden onset.  Further  plans to follow.      [name], MD  Electronically Signed     SGM/MEDQ  D:  10/07/2006  T:  10/07/2006  Job:  [id number]   cc:   Bevelyn Buckles. Bensimhon, MD  Corwin Levins, MD

## 2010-10-08 NOTE — Assessment & Plan Note (Signed)
[facility]                            CARDIOLOGY OFFICE NOTE   JORDELL, OUTTEN                      MRN:          [id number]  DATE:11/13/2006                            DOB:          [birth year]    PRIMARY CARE PHYSICIAN:  Corwin Levins, M.D.   INTERVAL HISTORY:  [name] is a 77 year old male with the history of  coronary artery disease, status post bypass surgery in 2000, as well as  carotid artery stenosis with 40-59% bilateral stenoses, hypertension,  hyperlipidemia and prostate cancer.  He returns today for routine  followup.   He has many complaints about his recent hospitalization for presyncope  and chest pain.  During that hospitalization, he says that he was made  to wait repeatedly by the doctors and nurses and he was disappointed  that I was not there at every moment to marshal his care.  He also says  that the results of the catheterization were never discussed with him  directly.   Catheterization was done on May 14.  This showed a 60-70% distal left  main stenosis.  The saphenous vein graft to the obtuse marginal was  patent.  The saphenous vein graft to the PDA was patent.  The LIMA to  the LAD was atretic, but there was good flow through the native left  main and LAD.  Ejection fraction was 65-70%.   He denies any chest pain.  He said that he has fixed his presyncope by  stopping his hydrochlorothiazide on his own.  He has not had any lower  extremity edema, no orthopnea, no PND.   CURRENT MEDICATIONS:  1. Fish oil 1000 twice a day  2. Aspirin 81 a day.  3. Altace 2.5 a day.  4. Zocor 40 a day.  5. Multivitamin.  6. Diazepam.  7. Famciclovir.   PHYSICAL EXAM:  He is ambulating around the room without any respiratory  difficulty.  Blood pressure is 146/72.  HEENT:  Normal.  NECK:  Supple.  No JVD.  He has bilateral carotid bruits, left greater  than right.  There is no lymphadenopathy or thyromegaly.  CARDIAC:   PMI is normal.  He has a regular rate and rhythm with an S4.  No murmurs, no rub.  LUNGS:  Clear.  ABDOMEN:  Obese, nontender, nondistended.  No hepatosplenomegaly, no  bruits, no masses appreciated.  EXTREMITIES: Warm with no cyanosis, clubbing or edema.  He does have a  soft bruit in his right groin at his catheterization site, none on the  left.  NEUROLOGIC:  He is alert and oriented times three.  Cranial nerves II  through XII are intact.  Moves all four extremities without any  difficulty.  His affect is somewhat oppositional.   ASSESSMENT AND PLAN:  1. Coronary artery disease.  This is quite stable.  He does have an      atretic LIMA with fairly significant distal left main stenosis.      However, I think if his left main stenosis becomes flow-limiting,      he will likely open up his  LIMA and thus I am not overly concerned.      He did have a recent Myoview, which showed no ischemia.  He will      continue current therapy.  2. Hypertension.  This is suboptimally controlled.  I increased his      Altace to 5 mg a day.  We will check repeat electrolytes in a week.  3. Hyperlipidemia.  Continue Zocor, check lipid panel next week and      titrate his statin to get his LDL below 70.  4. Right-groin bruit.  I suspect this is related to peripheral      vascular disease, but we need to make sure it is not a complication      of the catheterization.  We will check a right groin ultrasound.  5. Carotid stenoses.  He is due for a followup carotid ultrasound.  6. Apparent new-onset diabetes with a hemoglobin A1c of 6.2 and      moderately elevated fasting sugars.  He will follow with Dr. Melvyn Novas.     Bevelyn Buckles. Bensimhon, MD  Electronically Signed    DRB/MedQ  DD: 11/13/2006  DT: 11/13/2006  Job #: [id number]   cc:   Corwin Levins, MD

## 2010-10-08 NOTE — Op Note (Signed)
Allen Anderson, Allen Anderson               ACCOUNT NO.:  [id number]   MEDICAL RECORD NO.:  [id number]         PATIENT TYPE:  NESC   LOCATION:                               FACILITY:  [facility]   PHYSICIAN:  Ronald L. Earlene Plater, M.D.  DATE OF BIRTH:  November 26, 1928   DATE OF PROCEDURE:  09/24/2006  DATE OF DISCHARGE:                               OPERATIVE REPORT   DIAGNOSIS:  Adenocarcinoma of the prostate.   OPERATIVE PROCEDURE:  Transperineal robotic armed Nucletron implantation  of iodine-125 seeds.   SURGEON:  Gaynelle Arabian, MD   ASSISTANT:  Artist Pais. Kathrynn Running, MD   ANESTHESIA:  General endotracheal.   ESTIMATED BLOOD LOSS:  Negligible.   TUBES:  A 16-French Foley catheter.   COMPLICATIONS:  None.  A total of 19 needles were utilized to implant 52  iodine-125 seeds with a total apparent activity of 14.8200 mCi.   INDICATION FOR PROCEDURE:  Allen Anderson is a very nice 77 year old white  male who was found to have a palpable nodule and was subsequently on the  right apical area and was subsequently found have an elevated PSA of  4.65.  Biopsy of the prostate revealed a Gleason score 9, which is 4 + 5  adenocarcinoma involving 20% and 40% of 2 of 5 cores from the right  side.  He has considered all options.  He is currently on antigen-  ablation therapy, which he will continue for 2 years; it was first begun  in December 2007.  He also has completed external beam therapy with IMRT  and he is subsequently for seed implantation for combined radiation  therapy.   PROCEDURE IN DETAIL:  The patient was placed in supine position and  after proper general endotracheal anesthesia, he was placed in the  dorsal lithotomy position and prepped with Betadine in sterile fashion.  A 16-French Foley catheter was inserted and the bladder was drained and  the transrectal ultrasound probe was placed with a BNK biplaner  ultrasound.  The prostate was visualized.  Proper measurements were  obtained.  Dosimetry  was calculated.  Two holding needles were placed in  unused coordinates and utilizing both the electronic physical grid and  the robotic-armed Nucletron implanter, serial seed implantation was  performed; a total of 19 needles were utilized to implant 52 iodine-125  seeds.  Total apparent activity was 14.8200 mCi.  The patient tolerated  the procedure well.  A postop static image fluoroscopy revealed the  seeds were in good location.  The wound was dressed sterilely.  All  hardware was removed.  The Foley catheter was scanned and we could see  there were no seeds within it.  A flexible cystourethroscopy was then  performed.  There was noted to be some cystitis glandularis.  The  bladder was smooth-walled.  Efflux of clear urine was noted from the  normally placed ureteral orifices bilaterally and there were no  seeds or spacers or any other significant problems in the prostatic  urethra or in the bladder.  The flexible cystourethroscope was visually  removed.  A new 16-French Foley catheter was  inserted.  The bladder was  drained.  The patient was taken to the recovery room, stable.      Ronald L. Earlene Plater, M.D.  Electronically Signed     RLD/MEDQ  D:  09/24/2006  T:  09/24/2006  Job:  [id number]

## 2010-10-08 NOTE — H&P (Signed)
NAMEBRADIN, MCADORY NO.:  [id number]   MEDICAL RECORD NO.:  [id number]          PATIENT TYPE:  INP   LOCATION:  5523                         FACILITY:  [facility]   PHYSICIAN:  Brayton El, MD    DATE OF BIRTH:  [birth year]   DATE OF ADMISSION:  10/06/2006  DATE OF DISCHARGE:                              HISTORY & PHYSICAL   CHIEF COMPLAINT:  Chest pressure.   HISTORY OF PRESENT ILLNESS:  Allen Anderson is a 77 year old white male  with known coronary disease and a recent diagnosis of prostate cancer  status post XRT and recent brachytherapy presenting with a 1 day history  of chest pressure.  The patient states that this morning after he got  out of bed, he experienced chest pressure after walking around for  several minutes that was associated with some dizziness.  The patient  states the check discomfort appeared to radiate down both of his arms  and was relieved with rest.  Later on in the day the patient reports  additional episodes of chest discomfort occurring upon walking and being  relieved with rest.  The patient does not recall having any episodes of  chest pressure before today in recent time.   PAST MEDICAL HISTORY:  1. Coronary disease status post CABG in 2000.  There is an adenosine      Myoview in July 2007 that was negative for inducible ischemia.  At      that time he had an ejection fraction of 69%.  He has carotid      stenosis with 40% to 59% bilateral stenotic lesions from imaging      performed in July 2007.  2. He has hypertension.  3. Dyslipidemia.  4. History of bradycardia and therefore has not been able to tolerate      a beta blocker.  5. Prostate cancer.  6. Gleason 9 status post recent XRT for 5 weeks followed with      implantation of brachytherapy seeds.   SOCIAL HISTORY:  The patient lives with his wife.  Denies any tobacco or  alcohol.   FAMILY HISTORY:  Noncontributory.   ALLERGIES:  NO KNOWN DRUG ALLERGIES.   MEDICATIONS:  1. Aspirin 81 mg daily.  2. Altace 2.5 mg daily.  3. Hydrochlorothiazide 25 mg daily.  4. Zocor 40 mg daily.  5. Omega 3 fish oils.  6. Multivitamin.  7. Diazepam 2 mg daily.  8. Famciclovir 500 mg p.o. daily.  9. Uroxatral 10 mg p.o. nightly.   REVIEW OF SYSTEMS:  Positive for a recent diagnosis of shingles in the  buttock area.  He was also positive for increased frequency of urination  and increased urgency with urination.  The patient has a recent history  of diarrhea that he experience during his XRT.  He does deny any  orthopnea, PND, edema, palpitations, and syncope.  All other systems  were reviewed and are negative.   PHYSICAL EXAMINATION:  VITAL SIGNS:  He had a temperature 97.5, pulse  67, respirations 18, blood pressure 116/61.  GENERAL:  He is in no acute  distress.  HEENT:  Normocephalic, atraumatic.  Extraocular movements are intact.  NECK:  Supple without JVD.  There is a left-sided carotid bruits.  There  is no right-sided carotid bruits.  There is no lymphadenopathy  HEART:  Regular rate and rhythm without murmurs, rubs, or gallop.  LUNGS:  Clear to auscultation bilaterally.  ABDOMEN:  Soft, nontender.  EXTREMITIES:  Have no edema.  NEURO:  Nonfocal.  PSYCHIATRIC:  The patient is appropriate with normal levels of insight.  SKIN:  No evidence of rash or lesion, although the patient does report  shingles on his buttock, this was not examined today.   RADIOGRAPHS:  EKG independently reviewed by myself shows normal sinus  rhythm with a rate of 59, normal ST and T waves.   LABORATORY DATA:  Sodium 137, potassium 3.8, chloride 105, CO2 27, BUN  27, creatinine 1.4, glucose 121.  White cells 5, hemoglobin 11,  hematocrit 33, with glucose 207, his troponin was less than 0.05.  His  MB is 1.4.  He has a pH of 7.35 and a pCO2 of 47.   ASSESSMENT:  1. The patient's symptoms are concerning for unstable angina.  2. Dyslipidemia.  3. Hypertension.  4.  Prostate cancer.   PLAN:  1. We will admitted the patient to the telemetry unit and rule him out      for myocardial infarction.  We will increase his aspirin dose from      81 mg to 162 mg daily.  We will continue his Altace and Zocor.  The      patient will not be placed on a beta blocker at this time as he is      slightly bradycardic now and has a history of bradycardia that has      prevented him from being on a beta blocker in the past.  We will      make the patient n.p.o. after midnight for a probable pharmacologic      stress test tomorrow; however, the final decision will be left to      Dr. Gala Romney.  2. We will continue the patient's famciclovir for shingles and his      Uroxatral for urinary problems he has been having secondary to      radiation therapy.      Brayton El, MD     SGA/MEDQ  D:  10/06/2006  T:  10/07/2006  Job:  [id number]

## 2010-10-08 NOTE — Assessment & Plan Note (Signed)
[facility]                                 ON-CALL NOTE   BRENNON, OTTERNESS                      MRN:          [id number]  DATE:10/08/2006                            DOB:          1928/11/02    PRIMARY CARDIOLOGIST:  Bevelyn Buckles. Bensimhon, M.D.   Allen Anderson is a 77 year old male with known coronary artery disease.  He was having chest pain and symptoms concerning for unstable angina.  He was admitted to [facility] on Oct 06, 2006 and had a cardiac  catheterization on Oct 07, 2006.  The LIMA to LAD was atretic but  antegrade flow within the LAD was TIMI III, although the distal left  main had a 60-70% distal stenosis.  Medical therapy was recommended for  coronary artery disease.   Post procedure, Allen Anderson stated that he developed a severe headache  and had some testing done, including a CAT scan and multiple  medications, including morphine, which eventually relieved his headache.  He was eventually discharged on Oct 08, 2006.   Allen Anderson stated that his wife was on the phone with a friend when he  saw that [facility] was trying to get in touch with him on  caller ID.  He was not able to pick up the call but called the office so  that Dr. Gala Romney could be told that he was okay and his headache had  improved.  Allen Anderson felt that the headache came from stress and being  cold and not being given any sedation for the heart catheterization.  However, he stated that he is significantly improved and feels he will  do well at home.  He wanted to make sure that Dr. Gala Romney was aware of  this.  He already has a follow-up appointment scheduled with Dr.  Gala Romney and states that he will keep this.  Allen Anderson did not request  any medication and stated that he was compliant with the medications as  on his discharge sheet.      Theodore Demark, PA-C  Electronically Signed      Noralyn Pick. Eden Emms, MD, [facility]  Electronically Signed   RB/MedQ  DD: 10/08/2006  DT: 10/09/2006  Job #: [id number]

## 2010-10-08 NOTE — Discharge Summary (Signed)
Allen Anderson, Allen [name]:  [id number]   MEDICAL RECORD NO.:  [id number]          PATIENT TYPE:  INP   LOCATION:  5523                         FACILITY:  [facility]   PHYSICIAN:  Allen Abed, MD, FACCDATE OF BIRTH:  Nov 30, 1928   DATE OF ADMISSION:  10/06/2006  DATE OF DISCHARGE:  10/08/2006                               DISCHARGE SUMMARY   PROCEDURES PERFORMED DURING HOSPITALIZATION:  1. Cardiac catheterization completed on Oct 07, 2006 per Dr. Arvilla Anderson.      a.     Left main coronary artery is small in caliber and gives rise       to a left anterior descending and circumflex vessel.  There is       focal stenosis at approximately 60% to 70% in the distal left       main.  The left anterior descending is a medium-caliber vessel       with 2 diagonal branches.  There is a mild atherosclerosis noted       with luminal irregularities and approximately 30% stenosis in the       proximal to mid vessel segment.  On antegrade injections of the       left anterior descending there is TIMI-3 flow and no significant       competitive flow noted from the left internal mammary artery       graft.  The circumflex coronary artery is diseased at       approximately 50 in diffuse fashion in the mid vessel segment.       There is competitive flow noted in the distal vessel, including       the obtuse marginal system.  The right coronary artery is small in       caliber and diffusely diseased including approximately 70% diffuse       stenosis in the mid to distal vessel accumulating an 80% stenosis       before the take off of the posterior descending branch.  The       saphenous vein graft to the posterior descending branch of the       right coronary artery is widely patent.  This saphenous vein graft       to the obtuse marginal system is widely patent.  The left internal       mammary artery is selectively injected and noted to be       significantly atretic.  The  vessel branches and these are very       small branches into the vicinity of the surgical course and       staples near the left anterior descending, although there is no       clear conduit through the left anterior descending.  Left       ventriculography was performed with an ejection fraction of 65% to       70% with no focal wall motion abnormalities and 1+ mitral       regurgitation.   PRIMARY DISCHARGE DIAGNOSES:  1. Chest pain.  a.     Known coronary artery disease status post coronary artery       bypass graft in 2000.      b.     Myoview in July 2007 negative for inducible ischemia.  At       that time, he had an ejection fraction of 69%.      c.     Status post cardiac catheterization, Oct 07, 2006, as       described above.  2. Carotid artery disease.      a.     Carotid stenosis with 40% to 59% bilateral stenotic lesions       from imaging performed in July 2007.  3. Hypertension.  4. Dyslipidemia.  5. A history of bradycardia and unable to tolerate beta-blockers.  6. Prostate cancer.  7. Gleason 9 status post external radiation therapy for 5 weeks      followed by implantation of brachytherapy seeds.   HOSPITAL COURSE:  [name] is a 77 year old Caucasian male with a  known history of coronary artery disease and coronary artery bypass  grafting with recent diagnosis of prostate cancer and recent  brachytherapy presenting with 1-day history of chest pressure.  The  pressure and the pain occurred during exertion which was including  walking and also associated with some dizziness.  The patient also  states that when he tried to walk up some stairs he also had some chest  pressure.  Both episodes were relieved with rest.  The pain and pressure  in the chest did radiate down both arms and, again, was relieved with  rest.   The patient was seen and examined by Dr. Raynelle Anderson for Dr. Arvilla Anderson in the emergency room.  The patient was admitted to rule  out  stable angina in the setting of known coronary artery disease.  The  patient was admitted overnight with subsequent cardiac catheterization  the following morning with results as described above.  The patient had  no new areas of stenosis and the patient's coronary artery bypass grafts  were patent.  The patient recovered well but began to have a severe  headache the following morning.  The patient was given Tylenol with no  relief and subsequent morphine IV did relieve, but the patient states  that it made him too sleepy.  At the family's insistence, the patient  was scheduled for a CT scan of the head.  Results are pending at the  time of this discharge summary.   The patient's EKG did not show any acute ST-T wave abnormalities.  He  was found to be mildly bradycardic with a ventricular rate of 59 beats  per minute.  There was no evidence of heart block or arrhythmias.   The patient was seen and examined by Dr. Jerral Anderson on the day of  discharge with a lengthy discussion with the patient and the family  concerning the patient's health and prognosis.  The patient was to  ambulate in the hall and, if did well, was to be discharged today.  The  patient did ambulate without any difficulty and there were no further  complaints of chest pain.  Subsequent to discharge, however, the  patient's family insisted on CT scan of the head to rule out a blood  clot secondary to his continued headache symptoms.  This is being  performed at the time of discharge summary with addendum to follow  concerning results.   DISCHARGE LABORATORY DATA:  Hemoglobin 11.3, hematocrit 33.6, white  blood cells 4.7, platelets 207,000.  Sodium 134, potassium 3.9, chloride  103, CO2 25, glucose 154, BUN 16, creatinine 1.06.  Troponin 0.02, 0.02,  and 0.01 respectively.  Other cardiac biomarkers were found to be  negative.  Urinalysis was negative for UTI.  VITAL SIGNS:  Blood pressure 103/58, heart rate 72,  respirations 18, O2  sat 97% on room air, temperature 97.4.   DISCHARGE MEDICATIONS:  1. Aspirin 81 mg daily.  2. Altace 2.5 mg daily.  3. Hydrochlorothiazide 25 mg daily.  4. Zocor 40 mg daily.  5. Omega-3 fish oil daily.  6. Multivitamin daily.  7. Diazepam 2 mg daily.  8. Acyclovir 500 mg daily.  9. UroXatral 10 mg at bedtime.   ALLERGIES:  No known drug allergies.   FOLLOWUP PLANS AND APPOINTMENTS:  1. The patient is scheduled on November 13, 2006 at 9:45 for a followup      appointment with Dr. Gala Romney, which has been previously scheduled      prior to this admission.  2. The patient is to follow up with Dr. Oliver Barre, primary care      physician, for continuation of medical management.  3. The patient has been given post cardiac catheterization      instructions with particular emphasis on the right groin site for      evidence of bleeding, hematoma, or infection.   Time spent with the patient, to include physician time, 1 hour.  Please  see followup addendum for results of CT scan.      Bettey Mare. Lyman Bishop, NP      Allen Abed, MD, [facility]  Electronically Signed    KML/MEDQ  D:  10/08/2006  T:  10/08/2006  Job:  [id number]

## 2010-10-09 ENCOUNTER — Encounter: Payer: Self-pay | Admitting: Internal Medicine

## 2010-10-11 NOTE — Assessment & Plan Note (Signed)
[facility]                                 ON-CALL NOTE   [name], [name]                      MRN:          [id number]  DATE:06/15/2007                            DOB:          1929/02/26    I received a call from Allen Anderson concerning his Altace medication,  which was sent to him as Ramipril.  He was questioning whether or not  this was okay for him to take, as opposed to Altace, which he was  expecting.  I told the patient that he was receiving the generic version  of the Altace and that it was okay for him to take.  He verbalized  understanding.      Bettey Mare. Lyman Bishop, NP  Electronically Signed      Luis Abed, MD, [facility]  Electronically Signed   KML/MedQ  DD: 06/15/2007  DT: 06/15/2007  Job #: [id number]

## 2010-10-11 NOTE — Assessment & Plan Note (Signed)
[facility]                            CARDIOLOGY OFFICE NOTE   Allen Anderson, Allen Anderson                      MRN:          [id number]  DATE:05/04/2006                            DOB:          12/17/1928    PRIMARY CARE PHYSICIAN:  Corwin Levins, M.D.   PATIENT IDENTIFICATION:  Allen Anderson is a very pleasant 77 year old male  who returns today for routine follow-up.   PROBLEM LIST:  1. Coronary artery disease.      a.     Status post bypass surgery in 2000.      b.     Adenosine Myoview in July of 2007 showed an EF of 69% with       no ischemia.  2. Carotid artery stenosis, 40-59% bilaterally by ultrasound in July      of 2007.  3. Hypertension.  4. Hyperlipidemia.  5. Prostrate cancer, recently diagnosed.  6. Bradycardia.   MEDICATIONS:  1. Fish Oil 1000 mg b.i.d.  2. Vitamin E.  3. Hydrochlorothiazide 25 mg a day.  4. Aspirin 81 mg a day.  5. Altace 2.5 mg a day.  6. Zocor 40 mg a day.   INTERVAL HISTORY:  Allen Anderson returns for routine follow-up.  He is  doing very well from a cardiac point of view.  No chest pain or  shortness of breath.  He is fairly active.  He was recently diagnosed  with a prostate nodule that was told to be the fast-growing kind by  biopsy.  He is currently pending follow-up with urology to decide on a  treatment strategy.  He is obviously quite anxious about this.   PHYSICAL EXAMINATION:  GENERAL:  Well-appearing in no acute distress.  VITAL SIGNS:  Respirations are unlabored.  Blood pressure is 126/66,  heart rate 61, weight 189.  HEENT:  Sclerae anicteric, EOMI.  There is no xanthelasma.  Mucous  membranes are moist.  NECK:  Supple with no JVD.  HEART:  He is bradycardic and regular with S4.  LUNGS:  Clear.  ABDOMEN:  Soft, nontender, and nondistended.  No hepatosplenomegaly.  No  bruits, no masses, good bowel sounds.  EXTREMITIES:  Warm with no cyanosis, clubbing, or edema.  NEUROLOGY:  Alert and oriented  x3.  Cranial nerves II-XII grossly  intact.  He moves all four extremities without difficulty.  Affect is  bright.   EKG shows normal sinus rhythm at a rate of 61 with no significant ST-T  wave abnormalities.   ASSESSMENT:  1. Coronary artery disease.  He is quite stable.  Recent nuclear study      showed no ischemia.  We will just continue his current regimen.      Should he need surgery for his prostate, he will not need further      cardiac workup.  Unfortunately his heart rate is too low to      tolerate beta blocker.  2. Hypertension.  Blood pressure is well controlled.  3. Hyperlipidemia.  This is followed by Dr. Oliver Barre.  His      Simvastatin was recently  increased and obviously need to continue      to titrate his medicines to keep his LDL at 70 or below.     Bevelyn Buckles. Bensimhon, MD  Electronically Signed    DRB/MedQ  DD: 05/04/2006  DT: 05/04/2006  Job #: [id number]   cc:   Corwin Levins, MD

## 2010-10-15 ENCOUNTER — Encounter: Payer: Self-pay | Admitting: Internal Medicine

## 2010-10-15 ENCOUNTER — Ambulatory Visit (INDEPENDENT_AMBULATORY_CARE_PROVIDER_SITE_OTHER): Payer: Medicare Other | Admitting: Internal Medicine

## 2010-10-15 VITALS — BP 120/68 | HR 65 | Resp 16 | Ht 70.0 in | Wt 192.0 lb

## 2010-10-15 DIAGNOSIS — I2510 Atherosclerotic heart disease of native coronary artery without angina pectoris: Secondary | ICD-10-CM

## 2010-10-15 DIAGNOSIS — I6529 Occlusion and stenosis of unspecified carotid artery: Secondary | ICD-10-CM

## 2010-10-15 DIAGNOSIS — Z951 Presence of aortocoronary bypass graft: Secondary | ICD-10-CM | POA: Insufficient documentation

## 2010-10-15 DIAGNOSIS — E785 Hyperlipidemia, unspecified: Secondary | ICD-10-CM

## 2010-10-15 DIAGNOSIS — I10 Essential (primary) hypertension: Secondary | ICD-10-CM

## 2010-10-15 NOTE — Assessment & Plan Note (Signed)
Blood pressure well controlled. Continue current regimen.  

## 2010-10-15 NOTE — Assessment & Plan Note (Signed)
Asymptomatic. Due for yearly u/s. Continue Plavix and atorvastatin.

## 2010-10-15 NOTE — Assessment & Plan Note (Signed)
Followed by Dr. Clarene Duke. Goal LDL < 70. Titrate atorvastatin as needed.

## 2010-10-15 NOTE — Assessment & Plan Note (Signed)
No evidence of ischemia. Continue current regimen.   

## 2010-10-15 NOTE — Progress Notes (Signed)
HPI:  Allen Anderson is a 77 year old male with a history of coronary artery disease, status post bypass surgery in 2000.  He also has mild-to-moderate carotid stenosis, hypertension, borderline DM2, hyperlipidemia and prostate cancer.  He had a cardiac catheterization in May 2008 which showed stable revascularization.   Doing well from a cardiac perspective. Remains active. No CP or SOB. Still mowing the grass.Only real complaint is pain in the hips. Had carotid u/s in 3/11. L ICA <50% stable R ICA fine. On Plavix for possible TIA.  Doing well with his meds and watching diet.  Dr. Aida Puffer following lipids.     ROS: All systems negative except as listed in HPI, PMH and Problem List.  Past Medical History  Diagnosis Date  . Coronary artery disease     cath may 2008; stable  . Carotid stenosis     10/2008: R 0-39% L 40-59%     07/2009: L ICA < 50%  L ECA high-grade  R mild plaque  . Hyperlipidemia   . Hypertension   . Osteoarthritis   . Hearing loss   . PVD (peripheral vascular disease)   . History of prostate cancer   . History of colonic polyps     tubular adenoma  . Diabetes mellitus     type II, diet    Current Outpatient Prescriptions  Medication Sig Dispense Refill  . aspirin 81 MG tablet Take 81 mg by mouth daily.        Marland Kitchen atorvastatin (LIPITOR) 20 MG tablet Take 20 mg by mouth daily.        . Calcium Carb-Cholecalciferol (CALCIUM 1000 + D PO) Take 1,000 mg by mouth daily.        . clopidogrel (PLAVIX) 75 MG tablet Take 75 mg by mouth daily.        . fish oil-omega-3 fatty acids 1000 MG capsule Take 1,000 mg by mouth 2 (two) times daily.        Stephanie Acre (GLUCOSAMINE MSM COMPLEX PO) Take by mouth daily.        Marland Kitchen lubiprostone (AMITIZA) 8 MCG capsule Take 8 mcg by mouth daily.        . polyethylene glycol powder (MIRALAX) powder Take by mouth 2 (two) times daily. Mix one scoop in water 2 times a day as needed       . Tamsulosin HCl (FLOMAX) 0.4 MG CAPS  Take 0.4 mg by mouth daily.        . Wheat Dextrin (BENEFIBER) POWD Take by mouth daily. Take 2 teaspoons by mouth daily       . DISCONTD: dutasteride (AVODART) 0.5 MG capsule Take 0.5 mg by mouth daily.           PHYSICAL EXAM: Filed Vitals:   10/15/10 0936  BP: 120/68  Pulse: 65  Resp: 16   General:  well appearing. no resp difficulty HEENT: normal Neck: supple. no JVD. Carotids 2+ with bilat bruits. No lymphadenopathy or thryomegaly appreciated. Cor: PMI nondisplaced. Regular rate & rhythm. No rubs, murmur. +s4 Lungs: clear Abdomen: soft, nontender, nondistended. No hepatosplenomegaly. No bruits or masses. Good bowel sounds. Extremities: no cyanosis, clubbing, rash, edema Neuro: alert & orientedx3, cranial nerves grossly intact. moves all 4 extremities w/o difficulty. affect pleasant    ECG: NSR 65 No ST-T wave abnormalities.     ASSESSMENT & PLAN:

## 2010-10-15 NOTE — Patient Instructions (Signed)
Your physician has requested that you have a carotid duplex. This test is an ultrasound of the carotid arteries in your neck. It looks at blood flow through these arteries that supply the brain with blood. Allow one hour for this exam. There are no restrictions or special instructions.  Your physician wants you to follow-up in: 1 year.  You will receive a reminder letter in the mail two months in advance. If you don't receive a letter, please call our office to schedule the follow-up appointment.  

## 2010-10-29 ENCOUNTER — Other Ambulatory Visit: Payer: Self-pay | Admitting: *Deleted

## 2010-10-29 ENCOUNTER — Encounter (INDEPENDENT_AMBULATORY_CARE_PROVIDER_SITE_OTHER): Payer: Medicare Other | Admitting: *Deleted

## 2010-10-29 DIAGNOSIS — I6529 Occlusion and stenosis of unspecified carotid artery: Secondary | ICD-10-CM

## 2010-11-01 ENCOUNTER — Encounter: Payer: Self-pay | Admitting: Internal Medicine

## 2011-07-08 ENCOUNTER — Telehealth: Payer: Self-pay | Admitting: Internal Medicine

## 2011-07-08 NOTE — Telephone Encounter (Signed)
New Msg: pt calling wanting to speak with nurse to schedule appt to see Dr. Gala Romney. Please return pt call to discuss if pt needs to see Dr. Gala Romney is [facility], or if pt care needs to be transferred to another MD. Please return pt call to discuss further.   Pt would like to have appt May 28th at 11am, pt wife has to come that same day and he wants appt same day.

## 2011-07-08 NOTE — Telephone Encounter (Signed)
Pt very adamant that he wants to see Dr Gala Romney.  He was given an appt on 11/18/11 at 8:15 am.

## 2011-11-18 ENCOUNTER — Ambulatory Visit (INDEPENDENT_AMBULATORY_CARE_PROVIDER_SITE_OTHER): Payer: Medicare Other | Admitting: Internal Medicine

## 2011-11-18 ENCOUNTER — Encounter: Payer: Self-pay | Admitting: Internal Medicine

## 2011-11-18 VITALS — BP 106/52 | HR 65 | Ht 70.0 in | Wt 188.0 lb

## 2011-11-18 DIAGNOSIS — I6529 Occlusion and stenosis of unspecified carotid artery: Secondary | ICD-10-CM

## 2011-11-18 DIAGNOSIS — E785 Hyperlipidemia, unspecified: Secondary | ICD-10-CM

## 2011-11-18 DIAGNOSIS — I2510 Atherosclerotic heart disease of native coronary artery without angina pectoris: Secondary | ICD-10-CM

## 2011-11-18 MED ORDER — CLOPIDOGREL BISULFATE 75 MG PO TABS: 75.0000 mg | ORAL_TABLET | Freq: Every day | ORAL | Status: DC

## 2011-11-18 MED ORDER — ATORVASTATIN CALCIUM 20 MG PO TABS: 20.0000 mg | ORAL_TABLET | Freq: Every day | ORAL | Status: DC

## 2011-11-18 NOTE — Addendum Note (Signed)
Addended by: Freddi Starr on: 11/18/2011 08:46 AM   Modules accepted: Orders

## 2011-11-18 NOTE — Progress Notes (Signed)
HPI:  Mr. Danielson is a 78 year old male with a history of coronary artery disease, status post bypass surgery in 2000.  He also has mild-to-moderate carotid stenosis, hypertension, borderline DM2, hyperlipidemia and prostate cancer.  He had a cardiac catheterization in May 2008 which showed stable revascularization.   Doing well from a cardiac perspective. Remains active. No CP or SOB. Cutting 6 acres of grass and doing weed eating. Only real complaint is carpal tunnel syndrome. No focal neuro symptoms. No amaurosis.   Had carotid u/s in 6/12. L ICA <50% stable R ICA fine. On Plavix for h/o possible TIA.  Compliant with his meds and watching diet.  Dr. Aida Puffer following lipids.     ROS: All systems negative except as listed in HPI, PMH and Problem List.  Past Medical History  Diagnosis Date  . Coronary artery disease     cath may 2008; stable  . Carotid stenosis     10/2008: R 0-39% L 40-59%     07/2009: L ICA < 50%  L ECA high-grade  R mild plaque  . Hyperlipidemia   . Hypertension   . Osteoarthritis   . Hearing loss   . PVD (peripheral vascular disease)   . History of prostate cancer   . History of colonic polyps     tubular adenoma  . Diabetes mellitus     type II, diet    Current Outpatient Prescriptions  Medication Sig Dispense Refill  . aspirin 81 MG tablet Take 81 mg by mouth daily.        Marland Kitchen atorvastatin (LIPITOR) 20 MG tablet Take 20 mg by mouth daily.        . Calcium Carb-Cholecalciferol (CALCIUM 1000 + D PO) Take 1,000 mg by mouth daily.        . clopidogrel (PLAVIX) 75 MG tablet Take 75 mg by mouth daily.        . fish oil-omega-3 fatty acids 1000 MG capsule Take 1,000 mg by mouth 2 (two) times daily.        Stephanie Acre (GLUCOSAMINE MSM COMPLEX PO) Take by mouth daily.        . polyethylene glycol powder (MIRALAX) powder Take by mouth 2 (two) times daily. Mix one scoop in water 2 times a day as needed       . Tamsulosin HCl (FLOMAX) 0.4 MG  CAPS Take 0.4 mg by mouth daily.        . Wheat Dextrin (BENEFIBER) POWD Take by mouth daily. Take 2 teaspoons by mouth daily          PHYSICAL EXAM: Filed Vitals:   11/18/11 0812  BP: 106/52  Pulse: 65   General:  Elderly  well appearing. no resp difficulty HEENT: normal Neck: supple. no JVD. Carotids 2+ with bilat bruits. No lymphadenopathy or thryomegaly appreciated. Cor: PMI nondisplaced. Regular rate & rhythm. No rubs, murmur. +s4 Lungs: clear. Back small lipoma Abdomen: soft, nontender, nondistended. No hepatosplenomegaly. No bruits or masses. Good bowel sounds. Extremities: no cyanosis, clubbing, rash, edema Neuro: alert & orientedx3, cranial nerves grossly intact. moves all 4 extremities w/o difficulty. affect pleasant    ECG: NSR 65 No ST-T wave abnormalities.     ASSESSMENT & PLAN:

## 2011-11-18 NOTE — Assessment & Plan Note (Signed)
Followed by Dr. Clarene Duke. Goal LDL < 70. Continue atorvastatin.

## 2011-11-18 NOTE — Assessment & Plan Note (Signed)
Asymptomatic. Due for repeat u/s. Will arrange. Continue Plavix.

## 2011-11-18 NOTE — Patient Instructions (Addendum)
Your physician wants you to follow-up in: ONE YEAR WITH DR Luanna Cole will receive a reminder letter in the mail two months in advance. If you don't receive a letter, please call our office to schedule the follow-up appointment.   Your physician has requested that you have a carotid duplex. This test is an ultrasound of the carotid arteries in your neck. It looks at blood flow through these arteries that supply the brain with blood. Allow one hour for this exam. There are no restrictions or special instructions.

## 2011-11-18 NOTE — Assessment & Plan Note (Signed)
No evidence of ischemia. Continue current regimen.   

## 2011-12-05 ENCOUNTER — Encounter (INDEPENDENT_AMBULATORY_CARE_PROVIDER_SITE_OTHER): Payer: Medicare Other

## 2011-12-05 DIAGNOSIS — I6529 Occlusion and stenosis of unspecified carotid artery: Secondary | ICD-10-CM

## 2012-01-13 ENCOUNTER — Telehealth: Payer: Self-pay | Admitting: Internal Medicine

## 2012-01-13 NOTE — Telephone Encounter (Signed)
Spoke with pt, aware of carotid results. Office note, ekg and carotids faxed to dr Fayrene Fearing little at pts request.

## 2012-01-13 NOTE — Telephone Encounter (Signed)
Fu call  °Pt returning your call about test results °

## 2012-02-18 ENCOUNTER — Other Ambulatory Visit: Payer: Self-pay

## 2012-02-18 MED ORDER — POLYETHYLENE GLYCOL 3350 17 GM/SCOOP PO POWD: 17.0000 g | Freq: Two times a day (BID) | ORAL | Status: DC

## 2012-02-18 NOTE — Telephone Encounter (Signed)
Faxed Rx to the pharmacy. 

## 2012-11-12 ENCOUNTER — Other Ambulatory Visit: Payer: Self-pay | Admitting: Internal Medicine

## 2012-11-17 ENCOUNTER — Ambulatory Visit (INDEPENDENT_AMBULATORY_CARE_PROVIDER_SITE_OTHER): Payer: Medicare Other | Admitting: Cardiology

## 2012-11-17 ENCOUNTER — Encounter: Payer: Self-pay | Admitting: Cardiology

## 2012-11-17 VITALS — BP 148/80 | HR 65 | Wt 186.0 lb

## 2012-11-17 DIAGNOSIS — I2510 Atherosclerotic heart disease of native coronary artery without angina pectoris: Secondary | ICD-10-CM

## 2012-11-17 DIAGNOSIS — E7800 Pure hypercholesterolemia, unspecified: Secondary | ICD-10-CM

## 2012-11-17 NOTE — Assessment & Plan Note (Signed)
Continue aspirin and statin. Discontinue Plavix. Schedule followup carotid Dopplers.

## 2012-11-17 NOTE — Assessment & Plan Note (Signed)
Continue statin. Lipids and liver monitored by primary care. 

## 2012-11-17 NOTE — Assessment & Plan Note (Signed)
Continue aspirin and statin. I discussed a functional study today but he declined. Continue risk factor modification.

## 2012-11-17 NOTE — Patient Instructions (Addendum)
Your physician wants you to follow-up in: ONE YEAR WITH DR CRENSHAW You will receive a reminder letter in the mail two months in advance. If you don't receive a letter, please call our office to schedule the follow-up appointment.   STOP PLAVIX 

## 2012-11-17 NOTE — Progress Notes (Signed)
HPI: 79 year old male previously followed by Dr. Gala Romney for followup of coronary disease. He is status post bypass surgery in 2000. He also has mild-to-moderate carotid stenosis, hypertension, borderline DM2, hyperlipidemia and prostate cancer. He had a cardiac catheterization in May 2008 which showed stable revascularization. Carotid Dopplers in July of 2013 showed 40-59% left and 0-39% right stenosis. Followup recommended in one year. Patient last seen in June of 2013. Since then, the patient denies any dyspnea on exertion, orthopnea, PND, pedal edema, palpitations, syncope or chest pain.    Current Outpatient Prescriptions  Medication Sig Dispense Refill  . Ascorbic Acid (VITAMIN C) 1000 MG tablet Take 1,000 mg by mouth daily.      Marland Kitchen aspirin 81 MG tablet Take 81 mg by mouth daily.        Marland Kitchen atorvastatin (LIPITOR) 20 MG tablet TAKE 1 TABLET BY MOUTH DAILY  90 tablet  3  . Calcium Carb-Cholecalciferol (CALCIUM 1000 + D PO) Take 1,000 mg by mouth daily.        . clopidogrel (PLAVIX) 75 MG tablet Take 1 tablet (75 mg total) by mouth daily.  90 tablet  4  . fish oil-omega-3 fatty acids 1000 MG capsule Take 1,000 mg by mouth 2 (two) times daily.        Stephanie Acre (GLUCOSAMINE MSM COMPLEX PO) Take by mouth daily.        . polyethylene glycol powder (MIRALAX) powder Take 17 g by mouth 2 (two) times daily. Mix one scoop in water 2 times a day as needed  527 g  1  . Tamsulosin HCl (FLOMAX) 0.4 MG CAPS Take 0.4 mg by mouth daily.        . Wheat Dextrin (BENEFIBER) POWD Take by mouth daily. Take 2 teaspoons by mouth daily        No current facility-administered medications for this visit.     Past Medical History  Diagnosis Date  . Coronary artery disease     cath may 2008; stable  . Carotid stenosis     10/2008: R 0-39% L 40-59%     07/2009: L ICA < 50%  L ECA high-grade  R mild plaque  . Hyperlipidemia   . Hypertension   . Osteoarthritis   . Hearing loss   . PVD  (peripheral vascular disease)   . History of prostate cancer   . History of colonic polyps     tubular adenoma  . Diabetes mellitus     type II, diet    Past Surgical History  Procedure Laterality Date  . Coronary artery bypass graft  2000  . Prostate seed implant  2008    History   Social History  . Marital Status: Married    Spouse Name: N/A    Number of Children: N/A  . Years of Education: N/A   Occupational History  . Not on file.   Social History Main Topics  . Smoking status: Former Games developer  . Smokeless tobacco: Not on file  . Alcohol Use: No  . Drug Use: No  . Sexually Active:    Other Topics Concern  . Not on file   Social History Narrative  . No narrative on file    ROS: no fevers or chills, productive cough, hemoptysis, dysphasia, odynophagia, melena, hematochezia, dysuria, hematuria, rash, seizure activity, orthopnea, PND, pedal edema, claudication. Remaining systems are negative.  Physical Exam: Well-developed well-nourished in no acute distress.  Skin is warm and dry.  HEENT is normal.  Neck  is supple. Bilateral carotid bruits Chest is clear to auscultation with normal expansion.  Cardiovascular exam is regular rate and rhythm.  Abdominal exam nontender or distended. No masses palpated. Extremities show no edema. neuro grossly intact  ECG sinus rhythm at a rate of 65. PACs. No ST changes.

## 2012-12-10 ENCOUNTER — Encounter (INDEPENDENT_AMBULATORY_CARE_PROVIDER_SITE_OTHER): Payer: Medicare Other

## 2012-12-10 DIAGNOSIS — I6529 Occlusion and stenosis of unspecified carotid artery: Secondary | ICD-10-CM

## 2013-09-01 DIAGNOSIS — H04129 Dry eye syndrome of unspecified lacrimal gland: Secondary | ICD-10-CM | POA: Diagnosis not present

## 2013-09-01 DIAGNOSIS — H2589 Other age-related cataract: Secondary | ICD-10-CM | POA: Diagnosis not present

## 2013-09-01 DIAGNOSIS — H02839 Dermatochalasis of unspecified eye, unspecified eyelid: Secondary | ICD-10-CM | POA: Diagnosis not present

## 2013-09-20 DIAGNOSIS — L819 Disorder of pigmentation, unspecified: Secondary | ICD-10-CM | POA: Diagnosis not present

## 2013-09-20 DIAGNOSIS — Z85828 Personal history of other malignant neoplasm of skin: Secondary | ICD-10-CM | POA: Diagnosis not present

## 2013-09-20 DIAGNOSIS — L570 Actinic keratosis: Secondary | ICD-10-CM | POA: Diagnosis not present

## 2013-10-14 DIAGNOSIS — R059 Cough, unspecified: Secondary | ICD-10-CM | POA: Diagnosis not present

## 2013-10-14 DIAGNOSIS — J019 Acute sinusitis, unspecified: Secondary | ICD-10-CM | POA: Diagnosis not present

## 2013-10-14 DIAGNOSIS — R5381 Other malaise: Secondary | ICD-10-CM | POA: Diagnosis not present

## 2013-10-14 DIAGNOSIS — R509 Fever, unspecified: Secondary | ICD-10-CM | POA: Diagnosis not present

## 2013-10-14 DIAGNOSIS — R5383 Other fatigue: Secondary | ICD-10-CM | POA: Diagnosis not present

## 2013-10-14 DIAGNOSIS — R05 Cough: Secondary | ICD-10-CM | POA: Diagnosis not present

## 2013-11-15 DIAGNOSIS — C61 Malignant neoplasm of prostate: Secondary | ICD-10-CM | POA: Diagnosis not present

## 2013-11-22 DIAGNOSIS — C61 Malignant neoplasm of prostate: Secondary | ICD-10-CM | POA: Diagnosis not present

## 2013-11-22 DIAGNOSIS — N401 Enlarged prostate with lower urinary tract symptoms: Secondary | ICD-10-CM | POA: Diagnosis not present

## 2013-11-22 DIAGNOSIS — N139 Obstructive and reflux uropathy, unspecified: Secondary | ICD-10-CM | POA: Diagnosis not present

## 2013-11-23 ENCOUNTER — Ambulatory Visit: Payer: Medicare Other | Admitting: Cardiology

## 2013-12-09 ENCOUNTER — Ambulatory Visit: Payer: Medicare Other | Admitting: Cardiology

## 2013-12-16 ENCOUNTER — Telehealth: Payer: Self-pay

## 2013-12-16 ENCOUNTER — Telehealth: Payer: Self-pay | Admitting: Cardiology

## 2013-12-16 MED ORDER — ATORVASTATIN CALCIUM 20 MG PO TABS: ORAL_TABLET | ORAL | Status: DC

## 2013-12-16 NOTE — Telephone Encounter (Signed)
Patient hasn't been seen in 1 year. Has appointment with Dr Stanford Breed in October. Rx was sent to pharmacy electronically.

## 2013-12-16 NOTE — Telephone Encounter (Signed)
E-sent medication   90 day supply X 0 REFILLS Left patent message

## 2013-12-16 NOTE — Telephone Encounter (Signed)
Appointment on 10-1 with Dr. Stanford Breed.  Needs Lipitor before that time.  Uses [facility]. Needs 90 day supply.

## 2014-01-02 DIAGNOSIS — I6529 Occlusion and stenosis of unspecified carotid artery: Secondary | ICD-10-CM | POA: Diagnosis not present

## 2014-01-02 DIAGNOSIS — E538 Deficiency of other specified B group vitamins: Secondary | ICD-10-CM | POA: Diagnosis not present

## 2014-01-02 DIAGNOSIS — I10 Essential (primary) hypertension: Secondary | ICD-10-CM | POA: Diagnosis not present

## 2014-01-02 DIAGNOSIS — E559 Vitamin D deficiency, unspecified: Secondary | ICD-10-CM | POA: Diagnosis not present

## 2014-01-02 DIAGNOSIS — E162 Hypoglycemia, unspecified: Secondary | ICD-10-CM | POA: Diagnosis not present

## 2014-01-03 DIAGNOSIS — Z79899 Other long term (current) drug therapy: Secondary | ICD-10-CM | POA: Diagnosis not present

## 2014-01-03 DIAGNOSIS — Z5181 Encounter for therapeutic drug level monitoring: Secondary | ICD-10-CM | POA: Diagnosis not present

## 2014-01-18 ENCOUNTER — Ambulatory Visit: Payer: Medicare Other | Admitting: Cardiovascular Disease

## 2014-02-17 ENCOUNTER — Encounter: Payer: Self-pay | Admitting: Gastroenterology

## 2014-02-23 ENCOUNTER — Encounter: Payer: Self-pay | Admitting: *Deleted

## 2014-02-23 ENCOUNTER — Encounter: Payer: Self-pay | Admitting: Cardiology

## 2014-02-23 ENCOUNTER — Ambulatory Visit (INDEPENDENT_AMBULATORY_CARE_PROVIDER_SITE_OTHER): Payer: Medicare Other | Admitting: Cardiology

## 2014-02-23 VITALS — BP 142/78 | HR 54 | Ht 70.0 in | Wt 187.1 lb

## 2014-02-23 DIAGNOSIS — R0989 Other specified symptoms and signs involving the circulatory and respiratory systems: Secondary | ICD-10-CM

## 2014-02-23 DIAGNOSIS — I2510 Atherosclerotic heart disease of native coronary artery without angina pectoris: Secondary | ICD-10-CM | POA: Diagnosis not present

## 2014-02-23 NOTE — Assessment & Plan Note (Signed)
Continue aspirin and statin. Patient declines functional study. 

## 2014-02-23 NOTE — Patient Instructions (Signed)

## 2014-02-23 NOTE — Progress Notes (Signed)
HPI: FU coronary disease. He is status post bypass surgery in 2000. He also has mild-to-moderate carotid stenosis, hypertension, borderline DM2, hyperlipidemia and prostate cancer. He had a cardiac catheterization in May 2008 which showed stable revascularization. Carotid Dopplers in July of 2014 showed 40-59% left and 0-39% right stenosis. Followup recommended in one year. Since last seen, the patient denies any dyspnea on exertion, orthopnea, PND, pedal edema, palpitations, syncope or chest pain.     Current Outpatient Prescriptions  Medication Sig Dispense Refill  . Ascorbic Acid (VITAMIN C) 1000 MG tablet Take 1,000 mg by mouth daily.      Marland Kitchen aspirin 81 MG tablet Take 81 mg by mouth daily.        Marland Kitchen atorvastatin (LIPITOR) 20 MG tablet TAKE 1 TABLET BY MOUTH DAILY  90 tablet  1  . Calcium Carb-Cholecalciferol (CALCIUM 1000 + D PO) Take 1,000 mg by mouth daily.        . fish oil-omega-3 fatty acids 1000 MG capsule Take 1,000 mg by mouth 2 (two) times daily.        Donnie Aho (GLUCOSAMINE MSM COMPLEX PO) Take by mouth daily.        . polyethylene glycol powder (MIRALAX) powder Take 17 g by mouth 2 (two) times daily. Mix one scoop in water 2 times a day as needed  527 g  1  . Tamsulosin HCl (FLOMAX) 0.4 MG CAPS Take 0.4 mg by mouth daily.        . Wheat Dextrin (BENEFIBER) POWD Take by mouth daily. Take 2 teaspoons by mouth daily        No current facility-administered medications for this visit.     Past Medical History  Diagnosis Date  . Coronary artery disease     cath may 2008; stable  . Carotid stenosis     10/2008: R 0-39% L 40-59%     07/2009: L ICA < 50%  L ECA high-grade  R mild plaque  . Hyperlipidemia   . Hypertension   . Osteoarthritis   . Hearing loss   . PVD (peripheral vascular disease)   . History of prostate cancer   . History of colonic polyps     tubular adenoma  . Diabetes mellitus     type II, diet    Past Surgical History    Procedure Laterality Date  . Coronary artery bypass graft  2000  . Prostate seed implant  2008    History   Social History  . Marital Status: Married    Spouse Name: N/A    Number of Children: N/A  . Years of Education: N/A   Occupational History  . Not on file.   Social History Main Topics  . Smoking status: Former Research scientist (life sciences)  . Smokeless tobacco: Not on file  . Alcohol Use: No  . Drug Use: No  . Sexual Activity:    Other Topics Concern  . Not on file   Social History Narrative  . No narrative on file    ROS: no fevers or chills, productive cough, hemoptysis, dysphasia, odynophagia, melena, hematochezia, dysuria, hematuria, rash, seizure activity, orthopnea, PND, pedal edema, claudication. Remaining systems are negative.  Physical Exam: Well-developed well-nourished in no acute distress.  Skin is warm and dry.  HEENT is normal.  Neck is supple. Bilateral bruits Chest is clear to auscultation with normal expansion.  Cardiovascular exam is regular rate and rhythm.  Abdominal exam nontender or distended. No masses palpated. Extremities show  no edema. neuro grossly intact  ECG Sinus rhythm at a rate of 54. Cannot rule out prior septal infarct.First degree AV block.

## 2014-02-23 NOTE — Assessment & Plan Note (Signed)
Continue statin. Lipids and liver monitored by primary care. 

## 2014-02-23 NOTE — Assessment & Plan Note (Addendum)
Continue aspirin and statin. Schedule followup carotid Dopplers. 

## 2014-03-01 ENCOUNTER — Ambulatory Visit (HOSPITAL_COMMUNITY)
Admission: RE | Admit: 2014-03-01 | Discharge: 2014-03-01 | Disposition: A | Discharge status: Home / Self Care | Payer: Medicare Other | Source: Ambulatory Visit | Attending: Cardiovascular Disease | Admitting: Cardiovascular Disease

## 2014-03-01 DIAGNOSIS — R0989 Other specified symptoms and signs involving the circulatory and respiratory systems: Secondary | ICD-10-CM | POA: Insufficient documentation

## 2014-03-01 NOTE — Progress Notes (Signed)
Carotid Duplex Completed. Allen Anderson, BS, RDMS, RVT  

## 2014-05-18 ENCOUNTER — Encounter: Payer: Self-pay | Admitting: *Deleted

## 2014-06-27 ENCOUNTER — Encounter: Payer: Self-pay | Admitting: Internal Medicine

## 2014-07-06 ENCOUNTER — Other Ambulatory Visit: Payer: Self-pay | Admitting: Cardiology

## 2014-07-06 MED ORDER — ATORVASTATIN CALCIUM 20 MG PO TABS: ORAL_TABLET | ORAL | Status: DC

## 2014-07-06 NOTE — Telephone Encounter (Signed)
°  1. Which medications need to be refilled? Atorvastatin  2. Which pharmacy is medication to be sent to?[facility]  3. Do they need a 30 day or 90 day supply? 90 day supply for a year  4. Would they like a call back once the medication has been sent to the pharmacy? no

## 2014-07-06 NOTE — Telephone Encounter (Signed)
Rx(s) sent to pharmacy electronically.  

## 2014-09-28 DIAGNOSIS — H04123 Dry eye syndrome of bilateral lacrimal glands: Secondary | ICD-10-CM | POA: Diagnosis not present

## 2014-09-28 DIAGNOSIS — H02831 Dermatochalasis of right upper eyelid: Secondary | ICD-10-CM | POA: Diagnosis not present

## 2014-09-28 DIAGNOSIS — H35412 Lattice degeneration of retina, left eye: Secondary | ICD-10-CM | POA: Diagnosis not present

## 2014-09-28 DIAGNOSIS — H2513 Age-related nuclear cataract, bilateral: Secondary | ICD-10-CM | POA: Diagnosis not present

## 2014-09-28 DIAGNOSIS — H10413 Chronic giant papillary conjunctivitis, bilateral: Secondary | ICD-10-CM | POA: Diagnosis not present

## 2014-09-28 DIAGNOSIS — H02834 Dermatochalasis of left upper eyelid: Secondary | ICD-10-CM | POA: Diagnosis not present

## 2014-12-04 DIAGNOSIS — C61 Malignant neoplasm of prostate: Secondary | ICD-10-CM | POA: Diagnosis not present

## 2014-12-08 DIAGNOSIS — C61 Malignant neoplasm of prostate: Secondary | ICD-10-CM | POA: Diagnosis not present

## 2014-12-08 DIAGNOSIS — N401 Enlarged prostate with lower urinary tract symptoms: Secondary | ICD-10-CM | POA: Diagnosis not present

## 2014-12-08 DIAGNOSIS — R3916 Straining to void: Secondary | ICD-10-CM | POA: Diagnosis not present

## 2015-02-11 ENCOUNTER — Other Ambulatory Visit: Payer: Self-pay | Admitting: Cardiology

## 2015-02-26 DIAGNOSIS — H903 Sensorineural hearing loss, bilateral: Secondary | ICD-10-CM | POA: Diagnosis not present

## 2015-02-26 DIAGNOSIS — H9313 Tinnitus, bilateral: Secondary | ICD-10-CM | POA: Diagnosis not present

## 2015-03-09 ENCOUNTER — Encounter: Payer: Self-pay | Admitting: *Deleted

## 2015-03-11 NOTE — Progress Notes (Signed)
      HPI: FU coronary disease. He is status post bypass surgery in 2000. He also has mild-to-moderate carotid stenosis, hypertension, borderline DM2, hyperlipidemia and prostate cancer. He had a cardiac catheterization in May 2008 which showed stable revascularization. Carotid Dopplers in Oct 2015 showed no significant stenosis. Since last seen, He has some fatigue but denies dyspnea, chest pain, palpitations or syncope.  Current Outpatient Prescriptions  Medication Sig Dispense Refill  . Ascorbic Acid (VITAMIN C) 1000 MG tablet Take 1,000 mg by mouth daily.    Marland Kitchen aspirin 81 MG tablet Take 81 mg by mouth daily.      Marland Kitchen atorvastatin (LIPITOR) 20 MG tablet TAKE 1 TABLET BY MOUTH DAILY 90 tablet 2  . Calcium Carb-Cholecalciferol (CALCIUM 1000 + D PO) Take 1,000 mg by mouth daily.      . fish oil-omega-3 fatty acids 1000 MG capsule Take 1,000 mg by mouth 2 (two) times daily.      Donnie Aho (GLUCOSAMINE MSM COMPLEX PO) Take by mouth daily.      . polyethylene glycol powder (MIRALAX) powder Take 17 g by mouth 2 (two) times daily. Mix one scoop in water 2 times a day as needed 527 g 1  . Tamsulosin HCl (FLOMAX) 0.4 MG CAPS Take 0.4 mg by mouth daily.      . Wheat Dextrin (BENEFIBER) POWD Take by mouth daily. Take 2 teaspoons by mouth daily      No current facility-administered medications for this visit.     Past Medical History  Diagnosis Date  . Coronary artery disease     cath may 2008; stable  . Carotid stenosis     10/2008: R 0-39% L 40-59%     07/2009: L ICA < 50%  L ECA high-grade  R mild plaque  . Hyperlipidemia   . Hypertension   . Osteoarthritis   . Hearing loss   . PVD (peripheral vascular disease) ([place])   . History of prostate cancer   . History of colonic polyps   . Diabetes mellitus type 2, diet-controlled ([place])   . Tubular adenoma of colon     history of     Past Surgical History  Procedure Laterality Date  . Coronary artery bypass graft  2000  .  Insertion prostate radiation seed  2008    Social History   Social History  . Marital Status: Married    Spouse Name: N/A  . Number of Children: N/A  . Years of Education: N/A   Occupational History  . Not on file.   Social History Main Topics  . Smoking status: Former Research scientist (life sciences)  . Smokeless tobacco: Not on file  . Alcohol Use: No  . Drug Use: No  . Sexual Activity: Not on file   Other Topics Concern  . Not on file   Social History Narrative    ROS: Arthralgias but no fevers or chills, productive cough, hemoptysis, dysphasia, odynophagia, melena, hematochezia, dysuria, hematuria, rash, seizure activity, orthopnea, PND, pedal edema, claudication. Remaining systems are negative.  Physical Exam: Well-developed well-nourished in no acute distress.  Skin is warm and dry.  HEENT is normal.  Neck is supple.  Chest is clear to auscultation with normal expansion.  Cardiovascular exam is regular rate and rhythm.  Abdominal exam nontender or distended. No masses palpated. Extremities show no edema. neuro grossly intact  ECG Sinus rhythm with occasional PACs. Normal axis. No ST changes.

## 2015-03-12 ENCOUNTER — Ambulatory Visit (INDEPENDENT_AMBULATORY_CARE_PROVIDER_SITE_OTHER): Payer: Medicare Other | Admitting: Cardiology

## 2015-03-12 ENCOUNTER — Encounter: Payer: Self-pay | Admitting: Cardiology

## 2015-03-12 VITALS — BP 110/60 | HR 59 | Ht 70.0 in | Wt 179.2 lb

## 2015-03-12 DIAGNOSIS — E7800 Pure hypercholesterolemia, unspecified: Secondary | ICD-10-CM

## 2015-03-12 DIAGNOSIS — I2510 Atherosclerotic heart disease of native coronary artery without angina pectoris: Secondary | ICD-10-CM | POA: Diagnosis not present

## 2015-03-12 NOTE — Assessment & Plan Note (Signed)
Continue aspirin and statin. 

## 2015-03-12 NOTE — Assessment & Plan Note (Signed)
Continue statin. Lipids and liver Followed by primary care.

## 2015-03-12 NOTE — Assessment & Plan Note (Signed)
Blood pressure controlled. Continue present medications. 

## 2015-03-12 NOTE — Patient Instructions (Signed)
Your physician wants you to follow-up in: ONE YEAR WITH DR CRENSHAW You will receive a reminder letter in the mail two months in advance. If you don't receive a letter, please call our office to schedule the follow-up appointment.  

## 2015-04-02 ENCOUNTER — Telehealth: Payer: Self-pay | Admitting: Cardiology

## 2015-04-02 MED ORDER — ATORVASTATIN CALCIUM 20 MG PO TABS: ORAL_TABLET | ORAL | Status: DC

## 2015-04-02 NOTE — Telephone Encounter (Signed)
Pt's Rx sent to his pharmacy

## 2015-04-02 NOTE — Telephone Encounter (Signed)
°*  STAT* If patient is at the pharmacy, call can be transferred to refill team.   1. Which medications need to be refilled? (please list name of each medication and dose if known) Atorvastatin( 1 tab po daily)   2. Which pharmacy/location (including street and city if local pharmacy) is medication to be sent to?[facility]  3. Do they need a 30 day or 90 day supply? [place]

## 2015-05-25 ENCOUNTER — Telehealth: Payer: Self-pay | Admitting: Cardiology

## 2015-05-25 NOTE — Telephone Encounter (Signed)
He need to talk about his wife- Does not need this encounter.

## 2015-08-22 DIAGNOSIS — L814 Other melanin hyperpigmentation: Secondary | ICD-10-CM | POA: Diagnosis not present

## 2015-08-22 DIAGNOSIS — L570 Actinic keratosis: Secondary | ICD-10-CM | POA: Diagnosis not present

## 2015-08-22 DIAGNOSIS — Z85828 Personal history of other malignant neoplasm of skin: Secondary | ICD-10-CM | POA: Diagnosis not present

## 2015-12-05 DIAGNOSIS — C61 Malignant neoplasm of prostate: Secondary | ICD-10-CM | POA: Diagnosis not present

## 2015-12-11 DIAGNOSIS — C61 Malignant neoplasm of prostate: Secondary | ICD-10-CM | POA: Diagnosis not present

## 2015-12-11 DIAGNOSIS — N401 Enlarged prostate with lower urinary tract symptoms: Secondary | ICD-10-CM | POA: Diagnosis not present

## 2015-12-11 DIAGNOSIS — R351 Nocturia: Secondary | ICD-10-CM | POA: Diagnosis not present

## 2015-12-26 DIAGNOSIS — L929 Granulomatous disorder of the skin and subcutaneous tissue, unspecified: Secondary | ICD-10-CM | POA: Diagnosis not present

## 2015-12-26 DIAGNOSIS — Z85828 Personal history of other malignant neoplasm of skin: Secondary | ICD-10-CM | POA: Diagnosis not present

## 2015-12-26 DIAGNOSIS — L720 Epidermal cyst: Secondary | ICD-10-CM | POA: Diagnosis not present

## 2015-12-26 DIAGNOSIS — C44229 Squamous cell carcinoma of skin of left ear and external auricular canal: Secondary | ICD-10-CM | POA: Diagnosis not present

## 2015-12-28 DIAGNOSIS — H35412 Lattice degeneration of retina, left eye: Secondary | ICD-10-CM | POA: Diagnosis not present

## 2015-12-28 DIAGNOSIS — H25813 Combined forms of age-related cataract, bilateral: Secondary | ICD-10-CM | POA: Diagnosis not present

## 2015-12-28 DIAGNOSIS — H04123 Dry eye syndrome of bilateral lacrimal glands: Secondary | ICD-10-CM | POA: Diagnosis not present

## 2015-12-28 DIAGNOSIS — H02834 Dermatochalasis of left upper eyelid: Secondary | ICD-10-CM | POA: Diagnosis not present

## 2015-12-28 DIAGNOSIS — H5703 Miosis: Secondary | ICD-10-CM | POA: Diagnosis not present

## 2015-12-28 DIAGNOSIS — H02831 Dermatochalasis of right upper eyelid: Secondary | ICD-10-CM | POA: Diagnosis not present

## 2015-12-28 DIAGNOSIS — H10413 Chronic giant papillary conjunctivitis, bilateral: Secondary | ICD-10-CM | POA: Diagnosis not present

## 2016-03-05 ENCOUNTER — Encounter: Payer: Self-pay | Admitting: Cardiology

## 2016-03-07 NOTE — Progress Notes (Signed)
HPI: FU coronary disease. He is status post bypass surgery in 2000. He also has mild-to-moderate carotid stenosis, hypertension, borderline DM2, hyperlipidemia and prostate cancer. He had a cardiac catheterization in May 2008 which showed stable revascularization. Carotid Dopplers in Oct 2015 showed no significant stenosis. Since last seen, the patient denies any dyspnea on exertion, orthopnea, PND, pedal edema, palpitations, syncope or chest pain.   Current Outpatient Prescriptions  Medication Sig Dispense Refill  . Ascorbic Acid (VITAMIN C) 1000 MG tablet Take 1,000 mg by mouth daily.    Marland Kitchen aspirin 81 MG tablet Take 81 mg by mouth daily.      Marland Kitchen atorvastatin (LIPITOR) 20 MG tablet TAKE 1 TABLET BY MOUTH DAILY 90 tablet 3  . Calcium Carb-Cholecalciferol (CALCIUM 1000 + D PO) Take 1,000 mg by mouth daily.      . fish oil-omega-3 fatty acids 1000 MG capsule Take 1,000 mg by mouth daily.     Donnie Aho (GLUCOSAMINE MSM COMPLEX PO) Take by mouth daily.      . polyethylene glycol powder (MIRALAX) powder Take 17 g by mouth 2 (two) times daily. Mix one scoop in water 2 times a day as needed 527 g 1  . Tamsulosin HCl (FLOMAX) 0.4 MG CAPS Take 0.4 mg by mouth daily.      . Wheat Dextrin (BENEFIBER) POWD Take by mouth daily. Take 2 teaspoons by mouth daily      No current facility-administered medications for this visit.      Past Medical History:  Diagnosis Date  . Carotid stenosis    10/2008: R 0-39% L 40-59%     07/2009: L ICA < 50%  L ECA high-grade  R mild plaque  . Coronary artery disease    cath may 2008; stable  . Diabetes mellitus type 2, diet-controlled ([place])   . Hearing loss   . History of colonic polyps   . History of prostate cancer   . Hyperlipidemia   . Hypertension   . Osteoarthritis   . PVD (peripheral vascular disease) ([place])   . Tubular adenoma of colon    history of     Past Surgical History:  Procedure Laterality Date  . CORONARY ARTERY  BYPASS GRAFT  2000  . INSERTION PROSTATE RADIATION SEED  2008    Social History   Social History  . Marital status: Married    Spouse name: N/A  . Number of children: N/A  . Years of education: N/A   Occupational History  . Not on file.   Social History Main Topics  . Smoking status: Former Research scientist (life sciences)  . Smokeless tobacco: Never Used  . Alcohol use No  . Drug use: No  . Sexual activity: Not on file   Other Topics Concern  . Not on file   Social History Narrative  . No narrative on file    Family History  Problem Relation Age of Onset  . Congestive Heart Failure Father   . Heart disease Father   . Breast cancer Sister   . Stomach cancer Cousin   . Diabetes Cousin   . Diabetes Other   . Heart disease Other   . Prostate cancer Other   . Heart disease Other   . Colon cancer Neg Hx     ROS: arthralgias but no fevers or chills, productive cough, hemoptysis, dysphasia, odynophagia, melena, hematochezia, dysuria, hematuria, rash, seizure activity, orthopnea, PND, pedal edema, claudication. Remaining systems are negative.  Physical Exam: Well-developed well-nourished in  no acute distress.  Skin is warm and dry.  HEENT is normal.  Neck is supple.  Chest is clear to auscultation with normal expansion.  Cardiovascular exam is regular rate and rhythm.  Abdominal exam nontender or distended. No masses palpated. Extremities show no edema. neuro grossly intact  ECG-Sinus rhythm with PACs. First-degree AV block. Septal infarct.  A/P  1 Hyperlipidemia-continue statin. Lipids and liver are monitored by primary care.  2 hypertension-blood pressure controlled. Continue present medications.  3 carotid artery disease-continue aspirin and statin.   4 coronary artery disease-continue aspirin and statin.  Kirk Ruths, MD

## 2016-03-17 ENCOUNTER — Encounter: Payer: Self-pay | Admitting: Cardiology

## 2016-03-17 ENCOUNTER — Ambulatory Visit (INDEPENDENT_AMBULATORY_CARE_PROVIDER_SITE_OTHER): Payer: Medicare Other | Admitting: Cardiology

## 2016-03-17 VITALS — BP 128/60 | HR 64 | Ht 70.0 in | Wt 181.6 lb

## 2016-03-17 DIAGNOSIS — I10 Essential (primary) hypertension: Secondary | ICD-10-CM | POA: Diagnosis not present

## 2016-03-17 DIAGNOSIS — I2510 Atherosclerotic heart disease of native coronary artery without angina pectoris: Secondary | ICD-10-CM | POA: Diagnosis not present

## 2016-03-17 DIAGNOSIS — Z23 Encounter for immunization: Secondary | ICD-10-CM

## 2016-03-17 DIAGNOSIS — E7800 Pure hypercholesterolemia, unspecified: Secondary | ICD-10-CM

## 2016-03-17 NOTE — Patient Instructions (Signed)
Your physician wants you to follow-up in: ONE YEAR WITH DR CRENSHAW You will receive a reminder letter in the mail two months in advance. If you don't receive a letter, please call our office to schedule the follow-up appointment.   If you need a refill on your cardiac medications before your next appointment, please call your pharmacy.  

## 2016-03-24 DIAGNOSIS — E782 Mixed hyperlipidemia: Secondary | ICD-10-CM | POA: Diagnosis not present

## 2016-03-24 DIAGNOSIS — I10 Essential (primary) hypertension: Secondary | ICD-10-CM | POA: Diagnosis not present

## 2016-03-24 DIAGNOSIS — D649 Anemia, unspecified: Secondary | ICD-10-CM | POA: Diagnosis not present

## 2016-03-24 DIAGNOSIS — E559 Vitamin D deficiency, unspecified: Secondary | ICD-10-CM | POA: Diagnosis not present

## 2016-03-24 DIAGNOSIS — Z79899 Other long term (current) drug therapy: Secondary | ICD-10-CM | POA: Diagnosis not present

## 2016-03-27 ENCOUNTER — Other Ambulatory Visit: Payer: Self-pay | Admitting: Cardiology

## 2016-12-16 DIAGNOSIS — Z85828 Personal history of other malignant neoplasm of skin: Secondary | ICD-10-CM | POA: Diagnosis not present

## 2016-12-16 DIAGNOSIS — L570 Actinic keratosis: Secondary | ICD-10-CM | POA: Diagnosis not present

## 2016-12-23 DIAGNOSIS — R351 Nocturia: Secondary | ICD-10-CM | POA: Diagnosis not present

## 2016-12-23 DIAGNOSIS — N401 Enlarged prostate with lower urinary tract symptoms: Secondary | ICD-10-CM | POA: Diagnosis not present

## 2016-12-23 DIAGNOSIS — C61 Malignant neoplasm of prostate: Secondary | ICD-10-CM | POA: Diagnosis not present

## 2017-01-21 ENCOUNTER — Telehealth: Payer: Self-pay | Admitting: Cardiology

## 2017-01-21 NOTE — Telephone Encounter (Signed)
S/w pt he states that this message is about his wife's information  This was opened in error

## 2017-01-21 NOTE — Telephone Encounter (Signed)
New message    Pt is calling asking for a call back. He has a question about his medication. He said it's real important.

## 2017-03-17 NOTE — Progress Notes (Signed)
HPI: FU coronary disease. He is status post bypass surgery in 2000. He also has mild-to-moderate carotid stenosis, hypertension, borderline DM2, hyperlipidemia and prostate cancer. He had a cardiac catheterization in May 2008 which showed stable revascularization. Carotid Dopplers in Oct 2015 showed no significant stenosis. Since last seen, the patient has dyspnea with more extreme activities but not with routine activities. It is relieved with rest. It is not associated with chest pain. There is no orthopnea, PND or pedal edema. There is no syncope or palpitations. There is no exertional chest pain.   Current Outpatient Prescriptions  Medication Sig Dispense Refill  . Ascorbic Acid (VITAMIN C) 1000 MG tablet Take 1,000 mg by mouth daily.    Marland Kitchen aspirin 81 MG tablet Take 81 mg by mouth daily.      Marland Kitchen atorvastatin (LIPITOR) 20 MG tablet Take 1 tablet (20 mg total) by mouth daily. KEEP OV. 90 tablet 0  . Calcium Carb-Cholecalciferol (CALCIUM 1000 + D PO) Take 1,000 mg by mouth daily.      . fish oil-omega-3 fatty acids 1000 MG capsule Take 1,000 mg by mouth daily.     Allen Anderson (GLUCOSAMINE MSM COMPLEX PO) Take by mouth daily.      . polyethylene glycol powder (MIRALAX) powder Take 17 g by mouth 2 (two) times daily. Mix one scoop in water 2 times a day as needed 527 g 1  . Tamsulosin HCl (FLOMAX) 0.4 MG CAPS Take 0.4 mg by mouth daily.      . Wheat Dextrin (BENEFIBER) POWD Take by mouth daily. Take 2 teaspoons by mouth daily      No current facility-administered medications for this visit.      Past Medical History:  Diagnosis Date  . Carotid stenosis    10/2008: R 0-39% L 40-59%     07/2009: L ICA < 50%  L ECA high-grade  R mild plaque  . Coronary artery disease    cath may 2008; stable  . Diabetes mellitus type 2, diet-controlled ([place])   . Hearing loss   . History of colonic polyps   . History of prostate cancer   . Hyperlipidemia   . Hypertension   .  Osteoarthritis   . PVD (peripheral vascular disease) ([place])   . Tubular adenoma of colon    history of     Past Surgical History:  Procedure Laterality Date  . CORONARY ARTERY BYPASS GRAFT  2000  . INSERTION PROSTATE RADIATION SEED  2008    Social History   Social History  . Marital status: Married    Spouse name: N/A  . Number of children: N/A  . Years of education: N/A   Occupational History  . Not on file.   Social History Main Topics  . Smoking status: Former Research scientist (life sciences)  . Smokeless tobacco: Never Used  . Alcohol use No  . Drug use: No  . Sexual activity: Not on file   Other Topics Concern  . Not on file   Social History Narrative  . No narrative on file    Family History  Problem Relation Age of Onset  . Congestive Heart Failure Father   . Heart disease Father   . Breast cancer Sister   . Stomach cancer Cousin   . Diabetes Cousin   . Diabetes Other   . Heart disease Other   . Prostate cancer Other   . Heart disease Other   . Colon cancer Neg Hx  ROS: Leg numbness but no fevers or chills, productive cough, hemoptysis, dysphasia, odynophagia, melena, hematochezia, dysuria, hematuria, rash, seizure activity, orthopnea, PND, pedal edema, claudication. Remaining systems are negative.  Physical Exam: Well-developed well-nourished in no acute distress.  Skin is warm and dry.  HEENT is normal.  Neck is supple.  Chest is clear to auscultation with normal expansion.  Cardiovascular exam is regular rate and rhythm.  Abdominal exam nontender or distended. No masses palpated. Extremities show no edema. neuro grossly intact  ECG- Sinus rhythm with PACs. First-degree AV block. Anterior infarct. Left axis deviation. personally reviewed  A/P  1 Coronary artery disease status post coronary artery bypass and graft-patient continues to do well with no chest pain. Continue present medications including aspirin and statin.  2 hypertension-blood pressure is  controlled on no medications.  3 hyperlipidemia-continue statin. Lipids and liver are monitored by primary care.  4 carotid artery disease-mild on most recent Dopplers. Continue aspirin and statin.  Kirk Ruths, MD

## 2017-03-22 ENCOUNTER — Other Ambulatory Visit: Payer: Self-pay | Admitting: Cardiology

## 2017-03-23 NOTE — Telephone Encounter (Signed)
REFILL 

## 2017-03-26 ENCOUNTER — Encounter: Payer: Self-pay | Admitting: Cardiology

## 2017-03-26 ENCOUNTER — Ambulatory Visit (INDEPENDENT_AMBULATORY_CARE_PROVIDER_SITE_OTHER): Payer: Medicare Other | Admitting: Cardiology

## 2017-03-26 VITALS — BP 114/60 | HR 69 | Ht 70.0 in | Wt 181.0 lb

## 2017-03-26 DIAGNOSIS — I25810 Atherosclerosis of coronary artery bypass graft(s) without angina pectoris: Secondary | ICD-10-CM

## 2017-03-26 DIAGNOSIS — I10 Essential (primary) hypertension: Secondary | ICD-10-CM | POA: Diagnosis not present

## 2017-03-26 DIAGNOSIS — E7800 Pure hypercholesterolemia, unspecified: Secondary | ICD-10-CM

## 2017-03-26 NOTE — Patient Instructions (Signed)
Your physician wants you to follow-up in: ONE YEAR WITH DR CRENSHAW You will receive a reminder letter in the mail two months in advance. If you don't receive a letter, please call our office to schedule the follow-up appointment.   If you need a refill on your cardiac medications before your next appointment, please call your pharmacy.  

## 2017-04-14 DIAGNOSIS — H04123 Dry eye syndrome of bilateral lacrimal glands: Secondary | ICD-10-CM | POA: Diagnosis not present

## 2017-04-14 DIAGNOSIS — H10413 Chronic giant papillary conjunctivitis, bilateral: Secondary | ICD-10-CM | POA: Diagnosis not present

## 2017-04-14 DIAGNOSIS — H40033 Anatomical narrow angle, bilateral: Secondary | ICD-10-CM | POA: Diagnosis not present

## 2017-04-14 DIAGNOSIS — H25813 Combined forms of age-related cataract, bilateral: Secondary | ICD-10-CM | POA: Diagnosis not present

## 2017-06-15 ENCOUNTER — Other Ambulatory Visit: Payer: Self-pay | Admitting: Cardiology

## 2018-03-25 NOTE — Progress Notes (Signed)
HPI: FU coronary disease. He is status post bypass surgery in 2000. He also has mild-to-moderate carotid stenosis, hypertension, borderline DM2, hyperlipidemia and prostate cancer. He had a cardiac catheterization in May 2008 which showed stable revascularization. Carotid Dopplers in Oct 2015 showed no significant stenosis. Since last seen,  patient denies dyspnea, chest pain, palpitations or syncope.  Current Outpatient Medications  Medication Sig Dispense Refill  . Ascorbic Acid (VITAMIN C) 1000 MG tablet Take 1,000 mg by mouth daily.    Marland Kitchen aspirin 81 MG tablet Take 81 mg by mouth daily.      Marland Kitchen atorvastatin (LIPITOR) 20 MG tablet Take 1 tablet (20 mg total) by mouth daily at 6 PM. 90 tablet 2  . Calcium Carb-Cholecalciferol (CALCIUM 1000 + D PO) Take 1,000 mg by mouth daily.      . fish oil-omega-3 fatty acids 1000 MG capsule Take 1,000 mg by mouth daily.     Donnie Aho (GLUCOSAMINE MSM COMPLEX PO) Take by mouth daily.      . polyethylene glycol powder (MIRALAX) powder Take 17 g by mouth 2 (two) times daily. Mix one scoop in water 2 times a day as needed 527 g 1  . Tamsulosin HCl (FLOMAX) 0.4 MG CAPS Take 0.4 mg by mouth daily.      . Wheat Dextrin (BENEFIBER) POWD Take by mouth daily. Take 2 teaspoons by mouth daily      No current facility-administered medications for this visit.      Past Medical History:  Diagnosis Date  . Carotid stenosis    10/2008: R 0-39% L 40-59%     07/2009: L ICA < 50%  L ECA high-grade  R mild plaque  . Coronary artery disease    cath may 2008; stable  . Diabetes mellitus type 2, diet-controlled ([place])   . Hearing loss   . History of colonic polyps   . History of prostate cancer   . Hyperlipidemia   . Hypertension   . Osteoarthritis   . PVD (peripheral vascular disease) ([place])   . Tubular adenoma of colon    history of     Past Surgical History:  Procedure Laterality Date  . CORONARY ARTERY BYPASS GRAFT  2000  . INSERTION  PROSTATE RADIATION SEED  2008    Social History   Socioeconomic History  . Marital status: Married    Spouse name: Not on file  . Number of children: Not on file  . Years of education: Not on file  . Highest education level: Not on file  Occupational History  . Not on file  Social Needs  . Financial resource strain: Not on file  . Food insecurity:    Worry: Not on file    Inability: Not on file  . Transportation needs:    Medical: Not on file    Non-medical: Not on file  Tobacco Use  . Smoking status: Former Research scientist (life sciences)  . Smokeless tobacco: Never Used  Substance and Sexual Activity  . Alcohol use: No  . Drug use: No  . Sexual activity: Not on file  Lifestyle  . Physical activity:    Days per week: Not on file    Minutes per session: Not on file  . Stress: Not on file  Relationships  . Social connections:    Talks on phone: Not on file    Gets together: Not on file    Attends religious service: Not on file    Active member of club  or organization: Not on file    Attends meetings of clubs or organizations: Not on file    Relationship status: Not on file  . Intimate partner violence:    Fear of current or ex partner: Not on file    Emotionally abused: Not on file    Physically abused: Not on file    Forced sexual activity: Not on file  Other Topics Concern  . Not on file  Social History Narrative  . Not on file    Family History  Problem Relation Age of Onset  . Congestive Heart Failure Father   . Heart disease Father   . Breast cancer Sister   . Stomach cancer Cousin   . Diabetes Cousin   . Diabetes Other   . Heart disease Other   . Prostate cancer Other   . Heart disease Other   . Colon cancer Neg Hx     ROS: Bilateral hip and back pain but no fevers or chills, productive cough, hemoptysis, dysphasia, odynophagia, melena, hematochezia, dysuria, hematuria, rash, seizure activity, orthopnea, PND, pedal edema, claudication. Remaining systems are  negative.  Physical Exam: Well-developed well-nourished in no acute distress.  Skin is warm and dry.  HEENT is normal.  Neck is supple.  Chest is clear to auscultation with normal expansion.  Cardiovascular exam is regular rate and rhythm.  Abdominal exam nontender or distended. No masses palpated. Extremities show no edema. neuro grossly intact  ECG-sinus rhythm with PACs.  First-degree AV block.  Septal infarct.  Left axis deviation.  Personally reviewed  A/P  1 coronary artery disease-patient is doing well today with no symptoms of chest pain or increased dyspnea.  Continue medical therapy including aspirin and statin.  2 hyperlipidemia-continue statin.  Patient's lipids and liver are monitored by primary care.  3 hypertension-patient's blood pressure is controlled on no medications.  We will continue to follow and add as needed.  4 carotid artery disease-mild on most recent Dopplers.  Plan to continue medical therapy with aspirin and statin.  Kirk Ruths, MD

## 2018-04-06 ENCOUNTER — Ambulatory Visit (INDEPENDENT_AMBULATORY_CARE_PROVIDER_SITE_OTHER): Payer: Medicare Other | Admitting: Cardiology

## 2018-04-06 ENCOUNTER — Encounter: Payer: Self-pay | Admitting: Cardiology

## 2018-04-06 VITALS — BP 128/68 | HR 65 | Ht 70.0 in | Wt 176.0 lb

## 2018-04-06 DIAGNOSIS — I10 Essential (primary) hypertension: Secondary | ICD-10-CM | POA: Diagnosis not present

## 2018-04-06 DIAGNOSIS — I2510 Atherosclerotic heart disease of native coronary artery without angina pectoris: Secondary | ICD-10-CM | POA: Diagnosis not present

## 2018-04-06 DIAGNOSIS — E7800 Pure hypercholesterolemia, unspecified: Secondary | ICD-10-CM

## 2018-04-06 NOTE — Patient Instructions (Signed)
Medication Instructions:  Your physician recommends that you continue on your current medications as directed. Please refer to the Current Medication list given to you today.  If you need a refill on your cardiac medications before your next appointment, please call your pharmacy.   Lab work: None ordered If you have labs (blood work) drawn today and your tests are completely normal, you will receive your results only by: . MyChart Message (if you have MyChart) OR . A paper copy in the mail If you have any lab test that is abnormal or we need to change your treatment, we will call you to review the results.  Testing/Procedures: None ordered  Follow-Up: At [facility], you and your health needs are our priority.  As part of our continuing mission to provide you with exceptional heart care, we have created designated Provider Care Teams.  These Care Teams include your primary Cardiologist (physician) and Advanced Practice Providers (APPs -  Physician Assistants and Nurse Practitioners) who all work together to provide you with the care you need, when you need it. You will need a follow up appointment in 12 months.  Please call our office 2 months in advance to schedule this appointment.  You may see  Dr.Crenshaw or one of the following Advanced Practice Providers on your designated Care Team:   Luke Kilroy, PA-C Krista Kroeger, PA-C . Callie Goodrich, PA-C  Any Other Special Instructions Will Be Listed Below (If Applicable).    

## 2018-04-12 ENCOUNTER — Other Ambulatory Visit: Payer: Self-pay | Admitting: Cardiology

## 2018-06-21 ENCOUNTER — Ambulatory Visit (INDEPENDENT_AMBULATORY_CARE_PROVIDER_SITE_OTHER): Payer: Medicare Other | Admitting: Physician Assistant

## 2018-06-21 VITALS — BP 126/67 | HR 61 | Ht 70.0 in | Wt 179.0 lb

## 2018-06-21 DIAGNOSIS — I6523 Occlusion and stenosis of bilateral carotid arteries: Secondary | ICD-10-CM

## 2018-06-21 DIAGNOSIS — I25810 Atherosclerosis of coronary artery bypass graft(s) without angina pectoris: Secondary | ICD-10-CM | POA: Diagnosis not present

## 2018-06-21 DIAGNOSIS — R0600 Dyspnea, unspecified: Secondary | ICD-10-CM

## 2018-06-21 DIAGNOSIS — E785 Hyperlipidemia, unspecified: Secondary | ICD-10-CM | POA: Diagnosis not present

## 2018-06-21 DIAGNOSIS — E119 Type 2 diabetes mellitus without complications: Secondary | ICD-10-CM

## 2018-06-21 NOTE — Patient Instructions (Addendum)
Medication Instructions:  Your physician recommends that you continue on your current medications as directed. Please refer to the Current Medication list given to you today.  If you need a refill on your cardiac medications before your next appointment, please call your pharmacy.   Lab work: None ordered  If you have labs (blood work) drawn today and your tests are completely normal, you will receive your results only by: Marland Kitchen MyChart Message (if you have MyChart) OR . A paper copy in the mail If you have any lab test that is abnormal or we need to change your treatment, we will call you to review the results.  Testing/Procedures: Your physician has requested that you have an echocardiogram. Echocardiography is a painless test that uses sound waves to create images of your heart. It provides your doctor with information about the size and shape of your heart and how well your heart's chambers and valves are working. This procedure takes approximately one hour. There are no restrictions for this procedure.   Follow-Up: Your physician wants you to follow-up in: Allen Anderson   You will receive a reminder letter in the mail two months in advance. If you don't receive a letter, please call our office to schedule the follow-up appointment.  Any Other Special Instructions Will Be Listed Below (If Applicable).   Echocardiogram An echocardiogram is a procedure that uses painless sound waves (ultrasound) to produce an image of the heart. Images from an echocardiogram can provide important information about:  Signs of coronary artery disease (CAD).  Aneurysm detection. An aneurysm is a weak or damaged part of an artery wall that bulges out from the normal force of blood pumping through the body.  Heart size and shape. Changes in the size or shape of the heart can be associated with certain conditions, including heart failure, aneurysm, and CAD.  Heart muscle function.  Heart valve  function.  Signs of a past heart attack.  Fluid buildup around the heart.  Thickening of the heart muscle.  A tumor or infectious growth around the heart valves. Tell a health care provider about:  Any allergies you have.  All medicines you are taking, including vitamins, herbs, eye drops, creams, and over-the-counter medicines.  Any blood disorders you have.  Any surgeries you have had.  Any medical conditions you have.  Whether you are pregnant or may be pregnant. What are the risks? Generally, this is a safe procedure. However, problems may occur, including:  Allergic reaction to dye (contrast) that may be used during the procedure. What happens before the procedure? No specific preparation is needed. You may eat and drink normally. What happens during the procedure?   An IV tube may be inserted into one of your veins.  You may receive contrast through this tube. A contrast is an injection that improves the quality of the pictures from your heart.  A gel will be applied to your chest.  A wand-like tool (transducer) will be moved over your chest. The gel will help to transmit the sound waves from the transducer.  The sound waves will harmlessly bounce off of your heart to allow the heart images to be captured in real-time motion. The images will be recorded on a computer. The procedure may vary among health care providers and hospitals. What happens after the procedure?  You may return to your normal, everyday life, including diet, activities, and medicines, unless your health care provider tells you not to do that. Summary  An echocardiogram is a procedure that uses painless sound waves (ultrasound) to produce an image of the heart.  Images from an echocardiogram can provide important information about the size and shape of your heart, heart muscle function, heart valve function, and fluid buildup around your heart.  You do not need to do anything to prepare  before this procedure. You may eat and drink normally.  After the echocardiogram is completed, you may return to your normal, everyday life, unless your health care provider tells you not to do that. This information is not intended to replace advice given to you by your health care provider. Make sure you discuss any questions you have with your health care provider. Document Released: 05/09/2000 Document Revised: 06/14/2016 Document Reviewed: 06/14/2016 Elsevier Interactive Patient Education  2019 [employer].

## 2018-06-21 NOTE — Progress Notes (Signed)
Cardiology Office Note    Date:  06/23/2018   ID:  Allen Anderson, DOB 1928-05-31, MRN [id number]  PCP:  Tamsen Roers, MD  Cardiologist:  Dr. Stanford Breed   Chief Complaint  Patient presents with  . Follow-up    seen for Dr. Stanford Breed.     History of Present Illness:  Allen Anderson is a 85 y.o. male with PMH of CAD s/p CABG 2000, mild to moderate carotid artery disease, HTN, borderline DM II, HLD and prostate CA. he had a cardiac catheterization in May 2008 that showed a stable anatomy.  Carotid Doppler in October 2015 showed no significant change.  Patient was last seen by Dr. Stanford Breed on 04/06/2018, at which time he was doing well.  He presents today for earlier than expected visit along with his wife.  At first he thought he is here today to check the cholesterol, however based on the last office note, Dr. Stanford Breed deferred his cholesterol lab work to Dr. Rex Kras.  Our nursing staff has contacted Dr. Eddie Dibbles office, it appears he had a flu shot in his PCPs office back in October 2019, prior to that he has not been seen since 2017.  No lab work was obtained during his October's visit.  He plans to see Dr. Rex Kras back in the next few weeks, if so, I will defer the lipid panel to his PCPs office.  He denies any chest pain, however his wife has been noticing increasing dyspnea for the past 2 months.  He says he is walking shorter and shorter distance recently before he has to sit and rest.  Given lack of chest pain, I am very hesitant to proceed with any ischemic work-up.  However with increasing dyspnea, I will order a repeat echocardiogram.  On physical exam, he also appears to be euvolemic as well.  I do not see any lower extremity edema.  He does complain of bilateral lower extremity numbness and hip pain.  Apparently he recently obtained an MRI of the lower back and is scheduled to see orthopedic surgery in the next few weeks to discuss the MRI.  He thinks his lower extremity numbness is due  to spinal issues.   Past Medical History:  Diagnosis Date  . Carotid stenosis    10/2008: R 0-39% L 40-59%     07/2009: L ICA < 50%  L ECA high-grade  R mild plaque  . Coronary artery disease    cath may 2008; stable  . Diabetes mellitus type 2, diet-controlled ([place])   . Hearing loss   . History of colonic polyps   . History of prostate cancer   . Hyperlipidemia   . Hypertension   . Osteoarthritis   . PVD (peripheral vascular disease) ([place])   . Tubular adenoma of colon    history of     Past Surgical History:  Procedure Laterality Date  . CORONARY ARTERY BYPASS GRAFT  2000  . INSERTION PROSTATE RADIATION SEED  2008    Current Medications: Outpatient Medications Prior to Visit  Medication Sig Dispense Refill  . Ascorbic Acid (VITAMIN C) 1000 MG tablet Take 1,000 mg by mouth daily.    Marland Kitchen aspirin 81 MG tablet Take 81 mg by mouth daily.      Marland Kitchen atorvastatin (LIPITOR) 20 MG tablet TAKE 1 TABLET DAILY AT 6 P.M. 90 tablet 3  . Calcium Carb-Cholecalciferol (CALCIUM 1000 + D PO) Take 1,000 mg by mouth daily.      . fish oil-omega-3  fatty acids 1000 MG capsule Take 1,000 mg by mouth daily.     Donnie Aho (GLUCOSAMINE MSM COMPLEX PO) Take by mouth daily.      . polyethylene glycol powder (MIRALAX) powder Take 17 g by mouth 2 (two) times daily. Mix one scoop in water 2 times a day as needed 527 g 1  . Tamsulosin HCl (FLOMAX) 0.4 MG CAPS Take 0.4 mg by mouth daily.      . Wheat Dextrin (BENEFIBER) POWD Take by mouth daily. Take 2 teaspoons by mouth daily      No facility-administered medications prior to visit.      Allergies:   Beta adrenergic blockers   Social History   Socioeconomic History  . Marital status: Married    Spouse name: Not on file  . Number of children: Not on file  . Years of education: Not on file  . Highest education level: Not on file  Occupational History  . Not on file  Social Needs  . Financial resource strain: Not on file  . Food  insecurity:    Worry: Not on file    Inability: Not on file  . Transportation needs:    Medical: Not on file    Non-medical: Not on file  Tobacco Use  . Smoking status: Former Research scientist (life sciences)  . Smokeless tobacco: Never Used  Substance and Sexual Activity  . Alcohol use: No  . Drug use: No  . Sexual activity: Not on file  Lifestyle  . Physical activity:    Days per week: Not on file    Minutes per session: Not on file  . Stress: Not on file  Relationships  . Social connections:    Talks on phone: Not on file    Gets together: Not on file    Attends religious service: Not on file    Active member of club or organization: Not on file    Attends meetings of clubs or organizations: Not on file    Relationship status: Not on file  Other Topics Concern  . Not on file  Social History Narrative  . Not on file     Family History:  The patient's family history includes Breast cancer in his sister; Congestive Heart Failure in his father; Diabetes in his cousin and another family member; Heart disease in his father and other family members; Prostate cancer in an other family member; Stomach cancer in his cousin.   ROS:   Please see the history of present illness.    ROS All other systems reviewed and are negative.   PHYSICAL EXAM:   VS:  BP 126/67   Pulse 61   Ht 5\' 10"  (1.778 m)   Wt 179 lb (81.2 kg)   BMI 25.68 kg/m    GEN: Well nourished, well developed, in no acute distress  HEENT: normal  Neck: no JVD, carotid bruits, or masses Cardiac: RRR; no murmurs, rubs, or gallops,no edema  Respiratory:  clear to auscultation bilaterally, normal work of breathing GI: soft, nontender, nondistended, + BS MS: no deformity or atrophy  Skin: warm and dry, no rash Neuro:  Alert and Oriented x 3, Strength and sensation are intact Psych: euthymic mood, full affect  Wt Readings from Last 3 Encounters:  06/21/18 179 lb (81.2 kg)  04/06/18 176 lb (79.8 kg)  03/26/17 181 lb (82.1 kg)       Studies/Labs Reviewed:   EKG:  EKG is not ordered today.   Recent Labs: No results found for  requested labs within last 8760 hours.   Lipid Panel    Component Value Date/Time   CHOL 108 05/12/2007 1524   TRIG 122 05/12/2007 1524   HDL 34.8 (L) 05/12/2007 1524   CHOLHDL 3.1 CALC 05/12/2007 1524   VLDL 24 05/12/2007 1524   LDLCALC 49 05/12/2007 1524    Additional studies/ records that were reviewed today include:   N/A   ASSESSMENT:    1. Dyspnea, unspecified type   2. Coronary artery disease involving coronary bypass graft of native heart without angina pectoris   3. Bilateral carotid artery stenosis   4. Hyperlipidemia LDL goal <70   5. Controlled type 2 diabetes mellitus without complication, without long-term current use of insulin (HCC)      PLAN:  In order of problems listed above:  1. Dyspnea: Patient denies any recent chest pain or anginal symptom, his wife has been noticing increasing dyspnea with walking shorter distances.  I will start evaluation with echocardiogram.  If EF is low, then I will consider a ischemic work-up.  2. CAD s/p CABG: Bypass surgery was done around 2000.  Last cardiac catheterization was in 2008.  Continue aspirin and Lipitor  3. Bilateral carotid artery disease: We will need consider reevaluate with Doppler  4. Hyperlipidemia: On Lipitor 20 mg daily.  Will defer fasting lipid panel to primary care provider  5. DM2: Previous lab work always showed hemoglobin A1c around 6.4-6.6 range.  Patient is not on any diabetic medication.  Controlled using diet and exercise.    Medication Adjustments/Labs and Tests Ordered: Current medicines are reviewed at length with the patient today.  Concerns regarding medicines are outlined above.  Medication changes, Labs and Tests ordered today are listed in the Patient Instructions below. Patient Instructions  Medication Instructions:  Your physician recommends that you continue on your current  medications as directed. Please refer to the Current Medication list given to you today.  If you need a refill on your cardiac medications before your next appointment, please call your pharmacy.   Lab work: None ordered  If you have labs (blood work) drawn today and your tests are completely normal, you will receive your results only by: Marland Kitchen MyChart Message (if you have MyChart) OR . A paper copy in the mail If you have any lab test that is abnormal or we need to change your treatment, we will call you to review the results.  Testing/Procedures: Your physician has requested that you have an echocardiogram. Echocardiography is a painless test that uses sound waves to create images of your heart. It provides your doctor with information about the size and shape of your heart and how well your heart's chambers and valves are working. This procedure takes approximately one hour. There are no restrictions for this procedure.   Follow-Up: Your physician wants you to follow-up in: [place] DR. CRENSHAW   You will receive a reminder letter in the mail two months in advance. If you don't receive a letter, please call our office to schedule the follow-up appointment.  Any Other Special Instructions Will Be Listed Below (If Applicable).   Echocardiogram An echocardiogram is a procedure that uses painless sound waves (ultrasound) to produce an image of the heart. Images from an echocardiogram can provide important information about:  Signs of coronary artery disease (CAD).  Aneurysm detection. An aneurysm is a weak or damaged part of an artery wall that bulges out from the normal force of blood pumping through the body.  Heart size and shape. Changes in the size or shape of the heart can be associated with certain conditions, including heart failure, aneurysm, and CAD.  Heart muscle function.  Heart valve function.  Signs of a past heart attack.  Fluid buildup around the  heart.  Thickening of the heart muscle.  A tumor or infectious growth around the heart valves. Tell a health care provider about:  Any allergies you have.  All medicines you are taking, including vitamins, herbs, eye drops, creams, and over-the-counter medicines.  Any blood disorders you have.  Any surgeries you have had.  Any medical conditions you have.  Whether you are pregnant or may be pregnant. What are the risks? Generally, this is a safe procedure. However, problems may occur, including:  Allergic reaction to dye (contrast) that may be used during the procedure. What happens before the procedure? No specific preparation is needed. You may eat and drink normally. What happens during the procedure?   An IV tube may be inserted into one of your veins.  You may receive contrast through this tube. A contrast is an injection that improves the quality of the pictures from your heart.  A gel will be applied to your chest.  A wand-like tool (transducer) will be moved over your chest. The gel will help to transmit the sound waves from the transducer.  The sound waves will harmlessly bounce off of your heart to allow the heart images to be captured in real-time motion. The images will be recorded on a computer. The procedure may vary among health care providers and hospitals. What happens after the procedure?  You may return to your normal, everyday life, including diet, activities, and medicines, unless your health care provider tells you not to do that. Summary  An echocardiogram is a procedure that uses painless sound waves (ultrasound) to produce an image of the heart.  Images from an echocardiogram can provide important information about the size and shape of your heart, heart muscle function, heart valve function, and fluid buildup around your heart.  You do not need to do anything to prepare before this procedure. You may eat and drink normally.  After the  echocardiogram is completed, you may return to your normal, everyday life, unless your health care provider tells you not to do that. This information is not intended to replace advice given to you by your health care provider. Make sure you discuss any questions you have with your health care provider. Document Released: 05/09/2000 Document Revised: 06/14/2016 Document Reviewed: 06/14/2016 Elsevier Interactive Patient Education  2019 [place], [place], [place]  06/23/2018 11:26 PM    [facility] [address], [place], [place]  [address] Phone: [phone]; Fax: [phone]

## 2018-06-23 ENCOUNTER — Encounter: Payer: Self-pay | Admitting: Physician Assistant

## 2018-06-23 NOTE — Addendum Note (Signed)
Addended byEulas Post, Lukka Black on: 06/23/2018 11:29 PM   Modules accepted: Level of Service

## 2018-06-25 ENCOUNTER — Telehealth: Payer: Self-pay

## 2018-06-25 NOTE — Telephone Encounter (Signed)
Called patients spouse to go over lab and test results and patient wanted to know what kind of test he was having on the 11th of February. Informed patient that he was scheduled for a Echocardiogram. Patient wanted to know if he could eat before test. Reviewed test with patient and informed patient that he could eat prior to test.

## 2018-07-06 ENCOUNTER — Ambulatory Visit (HOSPITAL_COMMUNITY): Payer: Medicare Other | Attending: Cardiology

## 2018-07-06 DIAGNOSIS — R0600 Dyspnea, unspecified: Secondary | ICD-10-CM | POA: Diagnosis not present

## 2018-07-20 ENCOUNTER — Telehealth: Payer: Self-pay | Admitting: Cardiology

## 2018-07-20 ENCOUNTER — Other Ambulatory Visit: Payer: Self-pay

## 2018-07-20 ENCOUNTER — Telehealth: Payer: Self-pay

## 2018-07-20 DIAGNOSIS — R0600 Dyspnea, unspecified: Secondary | ICD-10-CM

## 2018-07-20 NOTE — Telephone Encounter (Signed)
Notes recorded by Jacqulynn Cadet, CMA on 07/20/2018 at 12:47 PM EST Patient's daughter Lucinda Dell returned my called that was left for her father. Mrs. Melina Fiddler is on the patient's DPR, I informed her of the chest Xray order placed for her father to have done at [facility] and his scheduled appointment with Dr. Stanford Breed.

## 2018-07-20 NOTE — Telephone Encounter (Signed)
Called and left a voice message for the patient to call back.

## 2018-07-20 NOTE — Progress Notes (Signed)
Patient's daughter Lucinda Dell returned my called that was left for her father. Mrs. Melina Fiddler is on the patient's DPR, I informed her of the chest Xray order placed for her father to have done at [facility] and his scheduled appointment with Dr. Stanford Breed.

## 2018-07-20 NOTE — Progress Notes (Signed)
Left voice message for the patient to call back.Order placed for chest Xray and patient is scheduled to see Dr. Stanford Breed for 10-11-2018 at 8:40 AM.

## 2018-07-20 NOTE — Telephone Encounter (Signed)
New Message  Patient returning Terrah's phone call.

## 2018-07-21 ENCOUNTER — Ambulatory Visit
Admission: RE | Admit: 2018-07-21 | Discharge: 2018-07-21 | Disposition: A | Discharge status: Home / Self Care | Payer: Medicare Other | Source: Ambulatory Visit | Attending: Physician Assistant | Admitting: Physician Assistant

## 2018-07-21 DIAGNOSIS — R0600 Dyspnea, unspecified: Secondary | ICD-10-CM

## 2018-07-21 NOTE — Progress Notes (Signed)
Good chest x ray, no acute finding, but does suggest chronic lung disease which may cause some of his shortness of breath.

## 2018-07-22 NOTE — Progress Notes (Signed)
The patient has been notified of the result and verbalized understanding.  All questions (if any) were answered. Jacqulynn Cadet, CMA 07/22/2018 12:19 PM

## 2018-07-26 ENCOUNTER — Telehealth: Payer: Self-pay | Admitting: Physician Assistant

## 2018-07-26 NOTE — Telephone Encounter (Signed)
° ° °  Patient calling to request call from nurse to discuss chest xray results again.

## 2018-07-26 NOTE — Telephone Encounter (Signed)
Spoke to patient , he wanted more clarification of  Xray results -  rn explain to patient results . 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 PATIENT VOICE UNDERSTANDING. Reform

## 2018-10-11 ENCOUNTER — Ambulatory Visit: Payer: Medicare Other | Admitting: Cardiology

## 2018-11-16 NOTE — Progress Notes (Signed)
HPI: FU coronary disease. He is status post bypass surgery in 2000. He also has mild-to-moderate carotid stenosis, hypertension, borderline DM2, hyperlipidemia and prostate cancer. He had a cardiac catheterization in May 2008 which showed stable revascularization. Carotid Dopplers in Oct 2015 showed no significant stenosis. Echo February 2020 showed normal LV function, moderate right ventricular enlargement reduced function, biatrial enlargement, moderate mitral regurgitation, moderate tricuspid regurgitation.  There was mention that patient was in atrial fibrillation.  Since last seen,patient denies chest pain or syncope.  He has fatigue after 15 minutes of exertion which is somewhat worse compared to previous.  He has developed bilateral lower extremity edema.  Occasional palpitations.  Current Outpatient Medications  Medication Sig Dispense Refill  . Ascorbic Acid (VITAMIN C) 1000 MG tablet Take 1,000 mg by mouth daily.    Marland Kitchen aspirin 81 MG tablet Take 81 mg by mouth daily.      Marland Kitchen atorvastatin (LIPITOR) 20 MG tablet TAKE 1 TABLET DAILY AT 6 P.M. 90 tablet 3  . Calcium Carb-Cholecalciferol (CALCIUM 1000 + D PO) Take 1,000 mg by mouth daily.      . fish oil-omega-3 fatty acids 1000 MG capsule Take 1,000 mg by mouth daily.     Donnie Aho (GLUCOSAMINE MSM COMPLEX PO) Take by mouth daily.      . polyethylene glycol powder (MIRALAX) powder Take 17 g by mouth 2 (two) times daily. Mix one scoop in water 2 times a day as needed 527 g 1  . Tamsulosin HCl (FLOMAX) 0.4 MG CAPS Take 0.4 mg by mouth daily.      . Wheat Dextrin (BENEFIBER) POWD Take by mouth daily. Take 2 teaspoons by mouth daily      No current facility-administered medications for this visit.      Past Medical History:  Diagnosis Date  . Carotid stenosis    10/2008: R 0-39% L 40-59%     07/2009: L ICA < 50%  L ECA high-grade  R mild plaque  . Coronary artery disease    cath may 2008; stable  . Diabetes  mellitus type 2, diet-controlled ([place])   . Hearing loss   . History of colonic polyps   . History of prostate cancer   . Hyperlipidemia   . Hypertension   . Osteoarthritis   . PVD (peripheral vascular disease) ([place])   . Tubular adenoma of colon    history of     Past Surgical History:  Procedure Laterality Date  . CORONARY ARTERY BYPASS GRAFT  2000  . INSERTION PROSTATE RADIATION SEED  2008    Social History   Socioeconomic History  . Marital status: Married    Spouse name: Not on file  . Number of children: Not on file  . Years of education: Not on file  . Highest education level: Not on file  Occupational History  . Not on file  Social Needs  . Financial resource strain: Not on file  . Food insecurity    Worry: Not on file    Inability: Not on file  . Transportation needs    Medical: Not on file    Non-medical: Not on file  Tobacco Use  . Smoking status: Former Research scientist (life sciences)  . Smokeless tobacco: Never Used  Substance and Sexual Activity  . Alcohol use: No  . Drug use: No  . Sexual activity: Not on file  Lifestyle  . Physical activity    Days per week: Not on file    Minutes  per session: Not on file  . Stress: Not on file  Relationships  . Social Herbalist on phone: Not on file    Gets together: Not on file    Attends religious service: Not on file    Active member of club or organization: Not on file    Attends meetings of clubs or organizations: Not on file    Relationship status: Not on file  . Intimate partner violence    Fear of current or ex partner: Not on file    Emotionally abused: Not on file    Physically abused: Not on file    Forced sexual activity: Not on file  Other Topics Concern  . Not on file  Social History Narrative  . Not on file    Family History  Problem Relation Age of Onset  . Congestive Heart Failure Father   . Heart disease Father   . Breast cancer Sister   . Stomach cancer Cousin   . Diabetes Cousin   .  Diabetes Other   . Heart disease Other   . Prostate cancer Other   . Heart disease Other   . Colon cancer Neg Hx     ROS: no fevers or chills, productive cough, hemoptysis, dysphasia, odynophagia, melena, hematochezia, dysuria, hematuria, rash, seizure activity, orthopnea, PND, pedal edema, claudication. Remaining systems are negative.  Physical Exam: Well-developed well-nourished in no acute distress.  Skin is warm and dry.  HEENT is normal.  Neck is supple.  Chest is clear to auscultation with normal expansion.  Cardiovascular exam is irregular Abdominal exam nontender or distended. No masses palpated. Extremities show 1+ edema. neuro grossly intact  ECG-atrial fibrillation at a rate of 54, left axis deviation, septal infarct, nonspecific ST changes.  Personally reviewed  A/P  1 coronary artery disease-patient denies chest pain.  Plan to continue medical therapy with statin.  Discontinue aspirin given need for apixaban.  2 carotid artery disease-mild on most recent Dopplers.  Plan to continue statin.  3 hypertension-patient's blood pressure is controlled on no medications.  Continue to follow.  4 hyperlipidemia-continue statin.  5 atrial fibrillation-patient with newly diagnosed atrial fibrillation.  He has some fatigue with activities worse compared to previous which may be related to his new arrhythmia.  His heart rate is controlled on no medications.  Discontinue aspirin.  Add apixaban 5 mg twice daily.  I will have him return to see an APP in 6 weeks.  If atrial fibrillation persists we will plan cardioversion.  Check TSH.  He also has increased pedal edema.  Add Lasix 20 mg daily and check potassium and renal function in 1 week.  Kirk Ruths, MD

## 2018-11-17 ENCOUNTER — Ambulatory Visit (INDEPENDENT_AMBULATORY_CARE_PROVIDER_SITE_OTHER): Payer: Medicare Other | Admitting: Cardiology

## 2018-11-17 ENCOUNTER — Encounter: Payer: Self-pay | Admitting: Cardiology

## 2018-11-17 ENCOUNTER — Other Ambulatory Visit: Payer: Self-pay

## 2018-11-17 ENCOUNTER — Other Ambulatory Visit (INDEPENDENT_AMBULATORY_CARE_PROVIDER_SITE_OTHER): Payer: Medicare Other

## 2018-11-17 VITALS — BP 144/78 | HR 54 | Temp 97.6°F | Ht 66.0 in | Wt 170.0 lb

## 2018-11-17 DIAGNOSIS — I6523 Occlusion and stenosis of bilateral carotid arteries: Secondary | ICD-10-CM | POA: Diagnosis not present

## 2018-11-17 DIAGNOSIS — I4819 Other persistent atrial fibrillation: Secondary | ICD-10-CM | POA: Diagnosis not present

## 2018-11-17 DIAGNOSIS — I25810 Atherosclerosis of coronary artery bypass graft(s) without angina pectoris: Secondary | ICD-10-CM

## 2018-11-17 DIAGNOSIS — I2510 Atherosclerotic heart disease of native coronary artery without angina pectoris: Secondary | ICD-10-CM

## 2018-11-17 DIAGNOSIS — I10 Essential (primary) hypertension: Secondary | ICD-10-CM

## 2018-11-17 DIAGNOSIS — E7800 Pure hypercholesterolemia, unspecified: Secondary | ICD-10-CM

## 2018-11-17 MED ORDER — FUROSEMIDE 20 MG PO TABS: 20.0000 mg | ORAL_TABLET | Freq: Every day | ORAL | 3 refills | Status: DC

## 2018-11-17 MED ORDER — APIXABAN 5 MG PO TABS: 5.0000 mg | ORAL_TABLET | Freq: Two times a day (BID) | ORAL | 6 refills | Status: DC

## 2018-11-17 NOTE — Patient Instructions (Signed)
Medication Instructions:  STOP ASPIRIN  START ELIQUIS 5 MG TWICE DAILY  START FUROSEMIDE 20 MG ONCE DAILY  If you need a refill on your cardiac medications before your next appointment, please call your pharmacy.   Lab work: Your physician recommends that you return for lab work in: [place]  If you have labs (blood work) drawn today and your tests are completely normal, you will receive your results only by: Marland Kitchen MyChart Message (if you have MyChart) OR . A paper copy in the mail If you have any lab test that is abnormal or we need to change your treatment, we will call you to review the results.  Follow-Up: At [facility], you and your health needs are our priority.  As part of our continuing mission to provide you with exceptional heart care, we have created designated Provider Care Teams.  These Care Teams include your primary Cardiologist (physician) and Advanced Practice Providers (APPs -  Physician Assistants and Nurse Practitioners) who all work together to provide you with the care you need, when you need it.  Your physician recommends that you schedule a follow-up appointment in: [place] physician recommends that you schedule a follow-up appointment in: [place]

## 2018-11-17 NOTE — Progress Notes (Signed)
Chart

## 2018-11-24 ENCOUNTER — Other Ambulatory Visit: Payer: Self-pay | Admitting: *Deleted

## 2018-11-24 DIAGNOSIS — I4819 Other persistent atrial fibrillation: Secondary | ICD-10-CM

## 2018-11-24 LAB — BASIC METABOLIC PANEL
BUN/Creatinine Ratio: 23 (ref 10–24)
BUN: 30 mg/dL — ABNORMAL HIGH (ref 8–27)
CO2: 26 mmol/L (ref 20–29)
Calcium: 9.4 mg/dL (ref 8.6–10.2)
Chloride: 98 mmol/L (ref 96–106)
Creatinine, Ser: 1.33 mg/dL — ABNORMAL HIGH (ref 0.76–1.27)
GFR calc Af Amer: 55 mL/min/{1.73_m2} — ABNORMAL LOW (ref >59)
GFR calc non Af Amer: 48 mL/min/{1.73_m2} — ABNORMAL LOW (ref >59)
Glucose: 121 mg/dL — ABNORMAL HIGH (ref 65–99)
Potassium: 4.7 mmol/L (ref 3.5–5.2)
Sodium: 138 mmol/L (ref 134–144)

## 2018-11-24 LAB — TSH: TSH: 2.11 u[IU]/mL (ref 0.450–4.500)

## 2018-11-24 MED ORDER — APIXABAN 5 MG PO TABS: 5.0000 mg | ORAL_TABLET | Freq: Two times a day (BID) | ORAL | 6 refills | Status: DC

## 2018-11-24 NOTE — Addendum Note (Signed)
Addended by: Harrington Challenger on: 11/24/2018 11:15 AM   Modules accepted: Orders

## 2018-11-24 NOTE — Telephone Encounter (Signed)
REFILL 

## 2018-11-25 ENCOUNTER — Encounter: Payer: Self-pay | Admitting: *Deleted

## 2018-12-29 ENCOUNTER — Encounter: Payer: Self-pay | Admitting: Cardiology

## 2018-12-29 ENCOUNTER — Ambulatory Visit (INDEPENDENT_AMBULATORY_CARE_PROVIDER_SITE_OTHER): Payer: Medicare Other | Admitting: General Practice

## 2018-12-29 ENCOUNTER — Other Ambulatory Visit: Payer: Self-pay

## 2018-12-29 VITALS — BP 126/60 | HR 95 | Ht 66.0 in | Wt 164.4 lb

## 2018-12-29 DIAGNOSIS — I2510 Atherosclerotic heart disease of native coronary artery without angina pectoris: Secondary | ICD-10-CM | POA: Diagnosis not present

## 2018-12-29 DIAGNOSIS — I10 Essential (primary) hypertension: Secondary | ICD-10-CM | POA: Diagnosis not present

## 2018-12-29 DIAGNOSIS — I4819 Other persistent atrial fibrillation: Secondary | ICD-10-CM

## 2018-12-29 DIAGNOSIS — E7800 Pure hypercholesterolemia, unspecified: Secondary | ICD-10-CM | POA: Diagnosis not present

## 2018-12-29 DIAGNOSIS — I6523 Occlusion and stenosis of bilateral carotid arteries: Secondary | ICD-10-CM

## 2018-12-29 MED ORDER — APIXABAN 5 MG PO TABS: 5.0000 mg | ORAL_TABLET | Freq: Two times a day (BID) | ORAL | 3 refills | Status: DC

## 2018-12-29 NOTE — Progress Notes (Signed)
Cardiology Clinic Note   Patient Name: Allen Anderson Date of Encounter: 12/29/2018  Primary Care Provider:  Tamsen Roers, MD Primary Cardiologist:  Kirk Ruths, MD  Patient Profile   Allen Anderson 85 year old male presents today for follow-up of his new onset atrial fibrillation.  Past Medical History    Past Medical History:  Diagnosis Date   Carotid stenosis    10/2008: R 0-39% L 40-59%     07/2009: L ICA < 50%  L ECA high-grade  R mild plaque   Coronary artery disease    cath may 2008; stable   Diabetes mellitus type 2, diet-controlled ([facility])    Hearing loss    History of colonic polyps    History of prostate cancer    Hyperlipidemia    Hypertension    Osteoarthritis    PVD (peripheral vascular disease) ([place])    Tubular adenoma of colon    history of    Past Surgical History:  Procedure Laterality Date   CORONARY ARTERY BYPASS GRAFT  2000   INSERTION PROSTATE RADIATION SEED  2008    Allergies  Allergies  Allergen Reactions   Beta Adrenergic Blockers     REACTION: bradycardia    History of Present Illness    [name] was last seen by Dr. Stanford Breed on 11/17/2018.  During that time he was experiencing increased activity intolerance.  And was found to be in atrial fibrillation.  He was placed on Eliquis and diuresed with Lasix 20 mg daily in hopes that he may spontaneously convert back to sinus rhythm.  He is status post coronary artery bypass surgery in 2000.  He has mild to moderate carotid stenosis, hypertension, borderline type 2 diabetes, hyperlipidemia and prostate cancer.  His cardiac catheterization in May 2008 showed stable revascularization.  Carotid Dopplers in October 2015 showed no significant stenosis.  His echocardiogram on February 2020 indicated normal LVEF, moderate right ventricular enlargement, reduced function, bilateral enlargement, moderate mitral regurgitation, and moderate tricuspid regurgitation.  He seen in clinic  today and states that he is still experiencing fatigue with physical activity.  He states that he is only able to walk about 100 feet before needing to stop to rest.  He still has slight bilateral lower extremity edema however, he does not notice palpitations.  He denies chest pain, increased shortness of breath, lower extremity edema,increased fatigue, palpitations, melena, hematuria, hemoptysis, diaphoresis, weakness, presyncope, syncope, orthopnea, and PND.  Home Medications    Prior to Admission medications   Medication Sig Start Date End Date Taking? Authorizing Provider  apixaban (ELIQUIS) 5 MG TABS tablet Take 1 tablet (5 mg total) by mouth 2 (two) times daily. 11/24/18   Lelon Perla, MD  Ascorbic Acid (VITAMIN C) 1000 MG tablet Take 1,000 mg by mouth daily.    [provider]  atorvastatin (LIPITOR) 20 MG tablet TAKE 1 TABLET DAILY AT 6 P.M. 04/12/18   Lelon Perla, MD  Calcium Carb-Cholecalciferol (CALCIUM 1000 + D PO) Take 1,000 mg by mouth daily.      [provider]  fish oil-omega-3 fatty acids 1000 MG capsule Take 1,000 mg by mouth daily.     [provider]  furosemide (LASIX) 20 MG tablet Take 1 tablet (20 mg total) by mouth daily. 11/17/18 02/15/19  Lelon Perla, MD  Glucos-MSM-C-Mn-Ginger-Willow (GLUCOSAMINE MSM COMPLEX PO) Take by mouth daily.      [provider]  polyethylene glycol powder (MIRALAX) powder Take 17 g by mouth  2 (two) times daily. Mix one scoop in water 2 times a day as needed 02/18/12   Sable Feil, MD  Tamsulosin HCl (FLOMAX) 0.4 MG CAPS Take 0.4 mg by mouth daily.      [provider]  Wheat Dextrin (BENEFIBER) POWD Take by mouth daily. Take 2 teaspoons by mouth daily     [provider]    Family History    Family History  Problem Relation Age of Onset   Congestive Heart Failure Father    Heart disease Father    Breast cancer Sister    Stomach cancer Cousin    Diabetes  Cousin    Diabetes Other    Heart disease Other    Prostate cancer Other    Heart disease Other    Colon cancer Neg Hx    He indicated that his mother is deceased. He indicated that his father is deceased. He indicated that the status of his sister is unknown. He indicated that the status of his cousin is unknown. He indicated that the status of his neg hx is unknown.  Social History    Social History   Socioeconomic History   Marital status: Married    Spouse name: Not on file   Number of children: Not on file   Years of education: Not on file   Highest education level: Not on file  Occupational History   Not on file  Social Needs   Financial resource strain: Not on file   Food insecurity    Worry: Not on file    Inability: Not on file   Transportation needs    Medical: Not on file    Non-medical: Not on file  Tobacco Use   Smoking status: Former Smoker   Smokeless tobacco: Never Used  Substance and Sexual Activity   Alcohol use: No   Drug use: No   Sexual activity: Not on file  Lifestyle   Physical activity    Days per week: Not on file    Minutes per session: Not on file   Stress: Not on file  Relationships   Social connections    Talks on phone: Not on file    Gets together: Not on file    Attends religious service: Not on file    Active member of club or organization: Not on file    Attends meetings of clubs or organizations: Not on file    Relationship status: Not on file   Intimate partner violence    Fear of current or ex partner: Not on file    Emotionally abused: Not on file    Physically abused: Not on file    Forced sexual activity: Not on file  Other Topics Concern   Not on file  Social History Narrative   Not on file     Review of Systems    General:  No chills, fever, night sweats or weight changes.  Cardiovascular:  No chest pain, dyspnea on exertion, edema, orthopnea, palpitations, paroxysmal nocturnal  dyspnea. Dermatological: No rash, lesions/masses Respiratory: No cough, dyspnea Urologic: No hematuria, dysuria Abdominal:   No nausea, vomiting, diarrhea, bright red blood per rectum, melena, or hematemesis Neurologic:  No visual changes, wkns, changes in mental status. All other systems reviewed and are otherwise negative except as noted above.  Physical Exam    VS:  BP 126/60    Pulse 95    Ht 5\' 6"  (1.676 m)    Wt 164 lb 6.4 oz (  74.6 kg)    SpO2 95%    BMI 26.53 kg/m  , BMI Body mass index is 26.53 kg/m. GEN: Well nourished, well developed, in no acute distress. HEENT: normal. Neck: Supple, no JVD, carotid bruits, or masses. Cardiac: Slow irregular rhythm, no murmurs, rubs, or gallops. No clubbing, cyanosis,+ bilateral slight lower extremity edema.  Radials/DP/PT 1+ and equal bilaterally.  Respiratory:  Respirations regular and unlabored, clear to auscultation bilaterally. GI: Soft, nontender, nondistended, BS + x 4. MS: no deformity or atrophy. Skin: warm and dry, no rash. Neuro:  Strength and sensation are intact. Psych: Normal affect.  Accessory Clinical Findings    ECG personally reviewed by me today-atrial fibrillation with slow ventricular response 50 bpm- No acute changes  EKG 11/17/2018: Atrial fibrillation with slow ventricular response 54 bpm  EKG 04/06/2018: Sinus rhythm  first-degree AV block with premature atrial complexes and bigeminy pattern 65 bpm  Echocardiogram 07/06/2018: IMPRESSIONS   1. The left ventricle has hyperdynamic systolic function of >97%. The cavity size was normal. Left ventricular diastology could not be evaluated secondary to atrial fibrillation.  2. The right ventricle has mildly reduced systolic function. The cavity was moderately enlarged. There is no increase in right ventricular wall thickness. Right ventricular systolic pressure is moderately elevated with an estimated pressure of 48.7  mmHg.  3. Left atrial size was severely  dilated.  4. Right atrial size was moderately dilated.  5. The mitral valve is normal in structure. Mitral valve regurgitation is moderate by color flow Doppler.  6. The tricuspid valve is normal in structure. Tricuspid valve regurgitation is moderate.  7. The aortic valve is tricuspid There is mild thickening and mild calcification of the aortic valve.  8. The pulmonic valve was normal in structure. Pulmonic valve regurgitation is mild by color flow Doppler.  Assessment & Plan   1.  Atrial fibrillation- newly diagnosed 11/17/2018, placed on Eliquis 5 mg twice daily and furosemide 20 mg daily.  Continues to be in atrial fibrillation.  Discussed with DOD DCCV.  Discuss benefits and risks of DCCV with Mr. Journey and his wife.  (Possibility of bradycardia and need for PPM, complications with anesthesia, benefits of being able to resume normal physical activities/activity tolerance).  Both were in agreement and willing to proceed.  All questions answered.This patients CHA2DS2-VASc Score and unadjusted Ischemic Stroke Rate (% per year) is equal to 5. Continue apixaban 5 mg tablet twice daily Continue furosemide 20 mg tablet daily Order BMP, CBC  2.  Coronary artery disease- no chest pain today.Status post coronary artery bypass surgery in 2000.  Cardiac catheterization in May 2008 showed stable revascularization.  Continue atorvastatin 20 mg tablet daily Heart healthy low-sodium diet Increase physical activity as tolerated  3.  Essential hypertension- 126/60 today, well-controlled No medical therapy at this time continue to monitor  4.  Hyperlipidemia- monitored by PCP Continue atorvastatin 20 mg tablet daily Heart healthy low-sodium diet Increase physical activity as tolerated  Disposition: Follow-up with Dr. Stanford Breed status post cardioversion.  Deberah Pelton, NP 12/29/2018, 11:41 AM

## 2018-12-29 NOTE — H&P (View-Only) (Signed)
Cardiology Clinic Note   Patient Name: [facility] Date of Encounter: 12/29/2018  Primary Care Provider:  Tamsen Roers, MD Primary Cardiologist:  Kirk Ruths, MD  Patient Profile   [facility] 85 year old male presents today for follow-up of his new onset atrial fibrillation.  Past Medical History    Past Medical History:  Diagnosis Date   Carotid stenosis    10/2008: R 0-39% L 40-59%     07/2009: L ICA < 50%  L ECA high-grade  R mild plaque   Coronary artery disease    cath may 2008; stable   Diabetes mellitus type 2, diet-controlled (HCC)    Hearing loss    History of colonic polyps    History of prostate cancer    Hyperlipidemia    Hypertension    Osteoarthritis    PVD (peripheral vascular disease) ([place])    Tubular adenoma of colon    history of    Past Surgical History:  Procedure Laterality Date   CORONARY ARTERY BYPASS GRAFT  2000   INSERTION PROSTATE RADIATION SEED  2008    Allergies  Allergies  Allergen Reactions   Beta Adrenergic Blockers     REACTION: bradycardia    History of Present Illness    [facility] was last seen by Dr. Stanford Breed on 11/17/2018.  During that time he was experiencing increased activity intolerance.  And was found to be in atrial fibrillation.  He was placed on Eliquis and diuresed with Lasix 20 mg daily in hopes that he may spontaneously convert back to sinus rhythm.  He is status post coronary artery bypass surgery in 2000.  He has mild to moderate carotid stenosis, hypertension, borderline type 2 diabetes, hyperlipidemia and prostate cancer.  His cardiac catheterization in May 2008 showed stable revascularization.  Carotid Dopplers in October 2015 showed no significant stenosis.  His echocardiogram on February 2020 indicated normal LVEF, moderate right ventricular enlargement, reduced function, bilateral enlargement, moderate mitral regurgitation, and moderate tricuspid regurgitation.  He seen in clinic  today and states that he is still experiencing fatigue with physical activity.  He states that he is only able to walk about 100 feet before needing to stop to rest.  He still has slight bilateral lower extremity edema however, he does not notice palpitations.  He denies chest pain, increased shortness of breath, lower extremity edema,increased fatigue, palpitations, melena, hematuria, hemoptysis, diaphoresis, weakness, presyncope, syncope, orthopnea, and PND.  Home Medications    Prior to Admission medications   Medication Sig Start Date End Date Taking? Authorizing Provider  apixaban (ELIQUIS) 5 MG TABS tablet Take 1 tablet (5 mg total) by mouth 2 (two) times daily. 11/24/18   Lelon Perla, MD  Ascorbic Acid (VITAMIN C) 1000 MG tablet Take 1,000 mg by mouth daily.    [provider]  atorvastatin (LIPITOR) 20 MG tablet TAKE 1 TABLET DAILY AT 6 P.M. 04/12/18   Lelon Perla, MD  Calcium Carb-Cholecalciferol (CALCIUM 1000 + D PO) Take 1,000 mg by mouth daily.      [provider]  fish oil-omega-3 fatty acids 1000 MG capsule Take 1,000 mg by mouth daily.     [provider]  furosemide (LASIX) 20 MG tablet Take 1 tablet (20 mg total) by mouth daily. 11/17/18 02/15/19  Lelon Perla, MD  Glucos-MSM-C-Mn-Ginger-Willow (GLUCOSAMINE MSM COMPLEX PO) Take by mouth daily.      [provider]  polyethylene glycol powder (MIRALAX) powder Take 17 g by mouth  2 (two) times daily. Mix one scoop in water 2 times a day as needed 02/18/12   Sable Feil, MD  Tamsulosin HCl (FLOMAX) 0.4 MG CAPS Take 0.4 mg by mouth daily.      [provider]  Wheat Dextrin (BENEFIBER) POWD Take by mouth daily. Take 2 teaspoons by mouth daily     [provider]    Family History    Family History  Problem Relation Age of Onset   Congestive Heart Failure Father    Heart disease Father    Breast cancer Sister    Stomach cancer Cousin    Diabetes  Cousin    Diabetes Other    Heart disease Other    Prostate cancer Other    Heart disease Other    Colon cancer Neg Hx    He indicated that his mother is deceased. He indicated that his father is deceased. He indicated that the status of his sister is unknown. He indicated that the status of his cousin is unknown. He indicated that the status of his neg hx is unknown.  Social History    Social History   Socioeconomic History   Marital status: Married    Spouse name: Not on file   Number of children: Not on file   Years of education: Not on file   Highest education level: Not on file  Occupational History   Not on file  Social Needs   Financial resource strain: Not on file   Food insecurity    Worry: Not on file    Inability: Not on file   Transportation needs    Medical: Not on file    Non-medical: Not on file  Tobacco Use   Smoking status: Former Smoker   Smokeless tobacco: Never Used  Substance and Sexual Activity   Alcohol use: No   Drug use: No   Sexual activity: Not on file  Lifestyle   Physical activity    Days per week: Not on file    Minutes per session: Not on file   Stress: Not on file  Relationships   Social connections    Talks on phone: Not on file    Gets together: Not on file    Attends religious service: Not on file    Active member of club or organization: Not on file    Attends meetings of clubs or organizations: Not on file    Relationship status: Not on file   Intimate partner violence    Fear of current or ex partner: Not on file    Emotionally abused: Not on file    Physically abused: Not on file    Forced sexual activity: Not on file  Other Topics Concern   Not on file  Social History Narrative   Not on file     Review of Systems    General:  No chills, fever, night sweats or weight changes.  Cardiovascular:  No chest pain, dyspnea on exertion, edema, orthopnea, palpitations, paroxysmal nocturnal  dyspnea. Dermatological: No rash, lesions/masses Respiratory: No cough, dyspnea Urologic: No hematuria, dysuria Abdominal:   No nausea, vomiting, diarrhea, bright red blood per rectum, melena, or hematemesis Neurologic:  No visual changes, wkns, changes in mental status. All other systems reviewed and are otherwise negative except as noted above.  Physical Exam    VS:  BP 126/60    Pulse 95    Ht 5\' 6"  (1.676 m)    Wt 164 lb 6.4 oz (  74.6 kg)    SpO2 95%    BMI 26.53 kg/m  , BMI Body mass index is 26.53 kg/m. GEN: Well nourished, well developed, in no acute distress. HEENT: normal. Neck: Supple, no JVD, carotid bruits, or masses. Cardiac: Slow irregular rhythm, no murmurs, rubs, or gallops. No clubbing, cyanosis,+ bilateral slight lower extremity edema.  Radials/DP/PT 1+ and equal bilaterally.  Respiratory:  Respirations regular and unlabored, clear to auscultation bilaterally. GI: Soft, nontender, nondistended, BS + x 4. MS: no deformity or atrophy. Skin: warm and dry, no rash. Neuro:  Strength and sensation are intact. Psych: Normal affect.  Accessory Clinical Findings    ECG personally reviewed by me today-atrial fibrillation with slow ventricular response 50 bpm- No acute changes  EKG 11/17/2018: Atrial fibrillation with slow ventricular response 54 bpm  EKG 04/06/2018: Sinus rhythm  first-degree AV block with premature atrial complexes and bigeminy pattern 65 bpm  Echocardiogram 07/06/2018: IMPRESSIONS   1. The left ventricle has hyperdynamic systolic function of >64%. The cavity size was normal. Left ventricular diastology could not be evaluated secondary to atrial fibrillation.  2. The right ventricle has mildly reduced systolic function. The cavity was moderately enlarged. There is no increase in right ventricular wall thickness. Right ventricular systolic pressure is moderately elevated with an estimated pressure of 48.7  mmHg.  3. Left atrial size was severely  dilated.  4. Right atrial size was moderately dilated.  5. The mitral valve is normal in structure. Mitral valve regurgitation is moderate by color flow Doppler.  6. The tricuspid valve is normal in structure. Tricuspid valve regurgitation is moderate.  7. The aortic valve is tricuspid There is mild thickening and mild calcification of the aortic valve.  8. The pulmonic valve was normal in structure. Pulmonic valve regurgitation is mild by color flow Doppler.  Assessment & Plan   1.  Atrial fibrillation- newly diagnosed 11/17/2018, placed on Eliquis 5 mg twice daily and furosemide 20 mg daily.  Continues to be in atrial fibrillation.  Discussed with DOD DCCV.  Discuss benefits and risks of DCCV with Mr. Tober and his wife.  (Possibility of bradycardia and need for PPM, complications with anesthesia, benefits of being able to resume normal physical activities/activity tolerance).  Both were in agreement and willing to proceed.  All questions answered.This patients CHA2DS2-VASc Score and unadjusted Ischemic Stroke Rate (% per year) is equal to 5. Continue apixaban 5 mg tablet twice daily Continue furosemide 20 mg tablet daily Order BMP, CBC  2.  Coronary artery disease- no chest pain today.Status post coronary artery bypass surgery in 2000.  Cardiac catheterization in May 2008 showed stable revascularization.  Continue atorvastatin 20 mg tablet daily Heart healthy low-sodium diet Increase physical activity as tolerated  3.  Essential hypertension- 126/60 today, well-controlled No medical therapy at this time continue to monitor  4.  Hyperlipidemia- monitored by PCP Continue atorvastatin 20 mg tablet daily Heart healthy low-sodium diet Increase physical activity as tolerated  Disposition: Follow-up with Dr. Stanford Breed status post cardioversion.  Deberah Pelton, NP 12/29/2018, 11:41 AM

## 2018-12-29 NOTE — Patient Instructions (Addendum)
Medication Instructions:  [place] Your physician recommends that you continue on your current medications as directed. Please refer to the Current Medication list given to you today. If you need a refill on your cardiac medications before your next appointment, please call your pharmacy.   Lab work: Your physician recommends that you return for lab work in: TODAY-BMET, CBC If you have labs (blood work) drawn today and your tests are completely normal, you will receive your results only by: [facility] MyChart Message (if you have MyChart) OR . A paper copy in the mail If you have any lab test that is abnormal or we need to change your treatment, we will call you to review the results.  Testing/Procedures: Your physician has recommended that you have a Cardioversion (DCCV). Electrical Cardioversion uses a jolt of electricity to your heart either through paddles or wired patches attached to your chest. This is a controlled, usually prescheduled, procedure. Defibrillation is done under light anesthesia in the [facility], and you usually go home the day of the procedure. This is done to get your heart back into a normal rhythm. You are not awake for the procedure. Please see the instruction sheet given to you today. SEE INSTRUCTIONS BELOW  Follow-Up: At [facility], you and your health needs are our priority.  As part of our continuing mission to provide you with exceptional heart care, we have created designated Provider Care Teams.  These Care Teams include your primary Cardiologist (physician) and Advanced Practice Providers (APPs -  Physician Assistants and Nurse Practitioners) who all work together to provide you with the care you need, when you need it. You will need a follow up appointment in [facility].  Please call our office 2 months in advance to schedule this appointment.  You may see Allen Ruths, MD or one of the following Advanced Practice Providers on your designated  Care Team:   Kerin Ransom, PA-C Roby Lofts, [place] . Sande Rives, PA-C  Any Other Special Instructions Will Be Listed Below (If Applicable). IF YOU DECIDE TO NOT HAVE THE CARDIOVERSION PLEASE CONTACT THE OFFICE AS SOON AS POSSIBLE     CARDIOVERSION INSTRUCTIONS   Dear [name],  You are scheduled for a Cardioversion. Cardioversion on [facility] 01/03/2019 with Dr. Dani Gobble CROITORU. Please arrive at the [facility] (Main Entrance A) at [facility]: [address] [place], [place] [address] at 7:45 am. (1 hour prior to procedure unless lab work is needed; if lab work is needed arrive 1.5 hours ahead)  DIET: Nothing to eat or drink after midnight except a sip of water with medications (see medication instructions below)  Medication Instructions: Continue your anticoagulant: ELIQUIS- AND DO NOT MISS A DOSE You will need to continue your anticoagulant after your procedure until you are told by your Provider that it is safe to stop  Labs: Come to the lab at [facility] between the hours of 8:00 am and 4:30 pm. You do not have to be fasting.  Covid Testing: YOU MUST COMPLETE A COVID TEST 4 DAYS PRIOR TO YOUR PROCEDURE. ONCE YOU COMPLETE YOUR COVID TEST YOU MUST REMAIN QUARANTINED UNTIL  THE DAY OF YOUR PROCEDURE.  YOUR COVID TEST WILL BE COMPLETED AT THE [facility] ON [address]. LOOK FOR THE SIGNS TO TELL YOU WHICH LINE TO GET IN. YOU WILL HAVE YOUR NOSE SWABBED FROM YOUR VEHICLE.  YOUR APPOINTMENT TIME FOR YOUR TEST IS: Thursday December 30, 2018  AT 2:15PM.  You must have a responsible person to drive you home and stay in the waiting area during your procedure. Failure to do so could result in cancellation.  Bring your insurance cards.  *Special Note: Every effort is made to have your procedure done on time. Occasionally there are emergencies that occur at the [facility] that may cause delays. Please be patient if a delay does occur.

## 2018-12-30 ENCOUNTER — Other Ambulatory Visit (HOSPITAL_COMMUNITY)
Admission: RE | Admit: 2018-12-30 | Discharge: 2018-12-30 | Disposition: A | Discharge status: Home / Self Care | Payer: Medicare Other | Source: Ambulatory Visit | Attending: Cardiovascular Disease | Admitting: Cardiovascular Disease

## 2018-12-30 DIAGNOSIS — Z01812 Encounter for preprocedural laboratory examination: Secondary | ICD-10-CM | POA: Diagnosis present

## 2018-12-30 DIAGNOSIS — Z20828 Contact with and (suspected) exposure to other viral communicable diseases: Secondary | ICD-10-CM | POA: Insufficient documentation

## 2018-12-30 LAB — BASIC METABOLIC PANEL
BUN/Creatinine Ratio: 22 (ref 10–24)
BUN: 31 mg/dL — ABNORMAL HIGH (ref 8–27)
CO2: 24 mmol/L (ref 20–29)
Calcium: 9.6 mg/dL (ref 8.6–10.2)
Chloride: 105 mmol/L (ref 96–106)
Creatinine, Ser: 1.42 mg/dL — ABNORMAL HIGH (ref 0.76–1.27)
GFR calc Af Amer: 51 mL/min/{1.73_m2} — ABNORMAL LOW (ref >59)
GFR calc non Af Amer: 44 mL/min/{1.73_m2} — ABNORMAL LOW (ref >59)
Glucose: 123 mg/dL — ABNORMAL HIGH (ref 65–99)
Potassium: 4.4 mmol/L (ref 3.5–5.2)
Sodium: 142 mmol/L (ref 134–144)

## 2018-12-30 LAB — CBC WITH DIFFERENTIAL/PLATELET
Basophils Absolute: 0 10*3/uL (ref 0.0–0.2)
Basos: 1 %
EOS (ABSOLUTE): 0 10*3/uL (ref 0.0–0.4)
Eos: 1 %
Hematocrit: 36.6 % — ABNORMAL LOW (ref 37.5–51.0)
Hemoglobin: 12.7 g/dL — ABNORMAL LOW (ref 13.0–17.7)
Immature Grans (Abs): 0 10*3/uL (ref 0.0–0.1)
Immature Granulocytes: 0 %
Lymphocytes Absolute: 1.4 10*3/uL (ref 0.7–3.1)
Lymphs: 27 %
MCH: 29.8 pg (ref 26.6–33.0)
MCHC: 34.7 g/dL (ref 31.5–35.7)
MCV: 86 fL (ref 79–97)
Monocytes Absolute: 0.4 10*3/uL (ref 0.1–0.9)
Monocytes: 9 %
Neutrophils Absolute: 3.1 10*3/uL (ref 1.4–7.0)
Neutrophils: 62 %
Platelets: 112 10*3/uL — ABNORMAL LOW (ref 150–450)
RBC: 4.26 x10E6/uL (ref 4.14–5.80)
RDW: 12.7 % (ref 11.6–15.4)
WBC: 5 10*3/uL (ref 3.4–10.8)

## 2018-12-30 LAB — SARS CORONAVIRUS 2 (TAT 6-24 HRS): SARS Coronavirus 2: NEGATIVE

## 2018-12-31 ENCOUNTER — Ambulatory Visit: Payer: Medicare Other | Admitting: Cardiology

## 2018-12-31 NOTE — Progress Notes (Signed)
Pt states he will remain quarantined at home until his procedure on Monday. Pt states he has no symptoms of illness

## 2019-01-03 ENCOUNTER — Encounter (HOSPITAL_COMMUNITY)
Admission: RE | Disposition: A | Discharge status: Home / Self Care | Payer: Self-pay | Source: Home / Self Care | Attending: Cardiovascular Disease

## 2019-01-03 ENCOUNTER — Ambulatory Visit (HOSPITAL_COMMUNITY): Payer: Medicare Other | Admitting: Certified Registered"

## 2019-01-03 ENCOUNTER — Ambulatory Visit (HOSPITAL_COMMUNITY)
Admission: RE | Admit: 2019-01-03 | Discharge: 2019-01-03 | Disposition: A | Discharge status: Home / Self Care | Payer: Medicare Other | Attending: Cardiovascular Disease | Admitting: Cardiovascular Disease

## 2019-01-03 ENCOUNTER — Encounter (HOSPITAL_COMMUNITY): Payer: Self-pay

## 2019-01-03 DIAGNOSIS — E1151 Type 2 diabetes mellitus with diabetic peripheral angiopathy without gangrene: Secondary | ICD-10-CM | POA: Diagnosis not present

## 2019-01-03 DIAGNOSIS — I2510 Atherosclerotic heart disease of native coronary artery without angina pectoris: Secondary | ICD-10-CM | POA: Insufficient documentation

## 2019-01-03 DIAGNOSIS — I4819 Other persistent atrial fibrillation: Secondary | ICD-10-CM | POA: Diagnosis not present

## 2019-01-03 DIAGNOSIS — E785 Hyperlipidemia, unspecified: Secondary | ICD-10-CM | POA: Diagnosis not present

## 2019-01-03 DIAGNOSIS — Z87891 Personal history of nicotine dependence: Secondary | ICD-10-CM | POA: Diagnosis not present

## 2019-01-03 DIAGNOSIS — I10 Essential (primary) hypertension: Secondary | ICD-10-CM | POA: Insufficient documentation

## 2019-01-03 DIAGNOSIS — Z8546 Personal history of malignant neoplasm of prostate: Secondary | ICD-10-CM | POA: Diagnosis not present

## 2019-01-03 DIAGNOSIS — I4891 Unspecified atrial fibrillation: Secondary | ICD-10-CM | POA: Diagnosis present

## 2019-01-03 DIAGNOSIS — Z951 Presence of aortocoronary bypass graft: Secondary | ICD-10-CM | POA: Insufficient documentation

## 2019-01-03 DIAGNOSIS — Z8719 Personal history of other diseases of the digestive system: Secondary | ICD-10-CM | POA: Diagnosis not present

## 2019-01-03 DIAGNOSIS — I6523 Occlusion and stenosis of bilateral carotid arteries: Secondary | ICD-10-CM | POA: Insufficient documentation

## 2019-01-03 DIAGNOSIS — Z79899 Other long term (current) drug therapy: Secondary | ICD-10-CM | POA: Insufficient documentation

## 2019-01-03 DIAGNOSIS — Z7901 Long term (current) use of anticoagulants: Secondary | ICD-10-CM | POA: Insufficient documentation

## 2019-01-03 DIAGNOSIS — M1990 Unspecified osteoarthritis, unspecified site: Secondary | ICD-10-CM | POA: Insufficient documentation

## 2019-01-03 HISTORY — PX: CARDIOVERSION: SHX1299

## 2019-01-03 SURGERY — CARDIOVERSION
Anesthesia: General

## 2019-01-03 MED ORDER — LIDOCAINE HCL (CARDIAC) PF 100 MG/5ML IV SOSY
PREFILLED_SYRINGE | INTRAVENOUS | Status: DC | PRN
  Administered 2019-01-03: 100 mg via INTRAVENOUS

## 2019-01-03 MED ORDER — PROPOFOL 10 MG/ML IV BOLUS
INTRAVENOUS | Status: DC | PRN
  Administered 2019-01-03: 40 mg via INTRAVENOUS
  Administered 2019-01-03: 20 mg via INTRAVENOUS

## 2019-01-03 MED ORDER — SODIUM CHLORIDE 0.9 % IV SOLN
INTRAVENOUS | Status: DC | PRN
  Administered 2019-01-03: 09:00:00 via INTRAVENOUS

## 2019-01-03 NOTE — Transfer of Care (Signed)
Immediate Anesthesia Transfer of Care Note  Patient: [facility]  Procedure(s) Performed: CARDIOVERSION (N/A )  Patient Location: Endoscopy Unit  Anesthesia Type:General  Level of Consciousness: awake, alert  and drowsy  Airway & Oxygen Therapy: Patient Spontanous Breathing  Post-op Assessment: Report given to RN, Post -op Vital signs reviewed and stable and Patient moving all extremities X 4  Post vital signs: Reviewed and stable  Last Vitals:  Vitals Value Taken Time  BP    Temp    Pulse    Resp    SpO2      Last Pain:  Vitals:   01/03/19 0826  TempSrc: Oral  PainSc: 0-No pain         Complications: No apparent anesthesia complications

## 2019-01-03 NOTE — Anesthesia Preprocedure Evaluation (Signed)
Anesthesia Evaluation  Patient identified by MRN, date of birth, ID band Patient awake    Reviewed: Allergy & Precautions, NPO status , Patient's Chart, lab work & pertinent test results  History of Anesthesia Complications Negative for: history of anesthetic complications  Airway Mallampati: II  TM Distance: >3 FB Neck ROM: Full    Dental   Pulmonary neg pulmonary ROS, former smoker,    Pulmonary exam normal        Cardiovascular hypertension, + CAD, + CABG and + Peripheral Vascular Disease  + dysrhythmias Atrial Fibrillation  Rhythm:Irregular Rate:Normal     Neuro/Psych Carotid stenosis negative psych ROS   GI/Hepatic negative GI ROS, Neg liver ROS,   Endo/Other  diabetes, Type 2  Renal/GU negative Renal ROS  negative genitourinary   Musculoskeletal  (+) Arthritis ,   Abdominal   Peds  Hematology negative hematology ROS (+)   Anesthesia Other Findings   Reproductive/Obstetrics                             Anesthesia Physical Anesthesia Plan  ASA: III  Anesthesia Plan: General   Post-op Pain Management:    Induction: Intravenous  PONV Risk Score and Plan: TIVA and Treatment may vary due to age or medical condition  Airway Management Planned: Mask  Additional Equipment: None  Intra-op Plan:   Post-operative Plan:   Informed Consent: I have reviewed the patients History and Physical, chart, labs and discussed the procedure including the risks, benefits and alternatives for the proposed anesthesia with the patient or authorized representative who has indicated his/her understanding and acceptance.       Plan Discussed with:   Anesthesia Plan Comments:         Anesthesia Quick Evaluation

## 2019-01-03 NOTE — Interval H&P Note (Signed)
History and Physical Interval Note:  01/03/2019 8:40 AM  [facility]  has presented today for surgery, with the diagnosis of AFIB.  The various methods of treatment have been discussed with the patient and family. After consideration of risks, benefits and other options for treatment, the patient has consented to  Procedure(s): CARDIOVERSION (N/A) as a surgical intervention.  The patient's history has been reviewed, patient examined, no change in status, stable for surgery.  I have reviewed the patient's chart and labs.  Questions were answered to the patient's satisfaction.     Sheretha Shadd

## 2019-01-03 NOTE — Op Note (Signed)
Procedure: Electrical Cardioversion Indications:  Atrial Fibrillation  Procedure Details:  Consent: Risks of procedure as well as the alternatives and risks of each were explained to the (patient/caregiver).  Consent for procedure obtained.  Time Out: Verified patient identification, verified procedure, site/side was marked, verified correct patient position, special equipment/implants available, medications/allergies/relevent history reviewed, required imaging and test results available.  Performed  Patient placed on cardiac monitor, pulse oximetry, supplemental oxygen as necessary.  Sedation given: Propofol, Anestesiology Pacer pads placed anterior and posterior chest.  Cardioverted 3 time(s).  Cardioversion with synchronized biphasic 120J, 200J, 200J (with pressure on anterior chest pad) shocks.  Evaluation: Findings: Post procedure EKG shows: Atrial Fibrillation Complications: None Patient did tolerate procedure well.  Time Spent Directly with the Patient:  30 minutes   Jahaan Vanwagner 01/03/2019, 9:05 AM

## 2019-01-03 NOTE — Anesthesia Postprocedure Evaluation (Signed)
Anesthesia Post Note  Patient: [facility]  Procedure(s) Performed: CARDIOVERSION (N/A )     Patient location during evaluation: Endoscopy Anesthesia Type: General Level of consciousness: awake and alert Pain management: pain level controlled Vital Signs Assessment: post-procedure vital signs reviewed and stable Respiratory status: spontaneous breathing, nonlabored ventilation and respiratory function stable Cardiovascular status: blood pressure returned to baseline and stable Postop Assessment: no apparent nausea or vomiting Anesthetic complications: no    Last Vitals:  Vitals:   01/03/19 0912 01/03/19 0915  BP: (!) 203/95   Pulse: 64 64  Resp: 18 15  Temp:    SpO2: 96% 95%    Last Pain:  Vitals:   01/03/19 0912  TempSrc:   PainSc: 0-No pain                 Lidia Collum

## 2019-01-04 ENCOUNTER — Encounter (HOSPITAL_COMMUNITY): Payer: Self-pay | Admitting: Cardiovascular Disease

## 2019-01-13 ENCOUNTER — Telehealth: Payer: Self-pay | Admitting: Cardiology

## 2019-01-13 NOTE — Telephone Encounter (Signed)
Spoke with dr Renaldo Fiddler, he saw the patient yesterday with complaints of occ double vision and fluttering sound in his right ear. Dr Renaldo Fiddler is concerned about an AV fistula and was wanting to make sure we had not done any imaging of the brain. Carotid results from 2016 and CT scan results from 2008 discussed with dr snipes.

## 2019-01-18 ENCOUNTER — Ambulatory Visit: Payer: Medicare Other | Admitting: Cardiology

## 2019-01-26 ENCOUNTER — Other Ambulatory Visit: Payer: Self-pay

## 2019-01-26 ENCOUNTER — Ambulatory Visit (INDEPENDENT_AMBULATORY_CARE_PROVIDER_SITE_OTHER): Payer: Medicare Other | Admitting: Cardiology

## 2019-01-26 ENCOUNTER — Encounter: Payer: Self-pay | Admitting: Cardiology

## 2019-01-26 VITALS — BP 128/72 | HR 54 | Temp 97.3°F | Ht 70.0 in | Wt 163.0 lb

## 2019-01-26 DIAGNOSIS — Z7901 Long term (current) use of anticoagulants: Secondary | ICD-10-CM | POA: Diagnosis not present

## 2019-01-26 DIAGNOSIS — I480 Paroxysmal atrial fibrillation: Secondary | ICD-10-CM | POA: Insufficient documentation

## 2019-01-26 DIAGNOSIS — I6523 Occlusion and stenosis of bilateral carotid arteries: Secondary | ICD-10-CM

## 2019-01-26 DIAGNOSIS — N183 Chronic kidney disease, stage 3 unspecified: Secondary | ICD-10-CM | POA: Insufficient documentation

## 2019-01-26 DIAGNOSIS — E785 Hyperlipidemia, unspecified: Secondary | ICD-10-CM

## 2019-01-26 DIAGNOSIS — I10 Essential (primary) hypertension: Secondary | ICD-10-CM

## 2019-01-26 DIAGNOSIS — Z951 Presence of aortocoronary bypass graft: Secondary | ICD-10-CM | POA: Diagnosis not present

## 2019-01-26 DIAGNOSIS — M48061 Spinal stenosis, lumbar region without neurogenic claudication: Secondary | ICD-10-CM

## 2019-01-26 DIAGNOSIS — M4800 Spinal stenosis, site unspecified: Secondary | ICD-10-CM | POA: Insufficient documentation

## 2019-01-26 DIAGNOSIS — E119 Type 2 diabetes mellitus without complications: Secondary | ICD-10-CM

## 2019-01-26 MED ORDER — APIXABAN 5 MG PO TABS: 5.0000 mg | ORAL_TABLET | Freq: Two times a day (BID) | ORAL | 3 refills | Status: DC

## 2019-01-26 NOTE — Progress Notes (Signed)
Cardiology Office Note:    Date:  01/26/2019   ID:  TARI JOINTER, DOB [birth year], MRN [id number]  PCP:  Tamsen Roers, MD  Cardiologist:  Kirk Ruths, MD  Electrophysiologist:  None   Referring MD: Tamsen Roers, MD   No chief complaint on file.   History of Present Illness:    Allen Anderson is a 85 y.o. male with a hx of CABG in 2000 with catheterization in 2008.  Other medical issues include treated hypertension, history of prostate cancer, mild carotid disease, chronic renal insufficiency stage III, and spinal stenosis with left lower extremity numbness and weakness.  In June 2020 he was noted to be in atrial fibrillation.  Eliquis was added and he was set up for an outpatient DC CV.  This was unsuccessful in converting him to normal sinus rhythm.  He is in the office today for follow-up.  He remains in atrial fibrillation with controlled ventricular response on no AV nodal blocking agents.  He is somewhat of a rambling historian.  He has some weakness but its hard to tell if this is from his left leg or from AF. He has not had syncope or tachycardia. He denies any tachycardia.   Past Medical History:  Diagnosis Date  . Carotid stenosis    10/2008: R 0-39% L 40-59%     07/2009: L ICA < 50%  L ECA high-grade  R mild plaque  . Coronary artery disease    cath may 2008; stable  . Diabetes mellitus type 2, diet-controlled ([place])   . Hearing loss   . History of colonic polyps   . History of prostate cancer   . Hyperlipidemia   . Hypertension   . Osteoarthritis   . PVD (peripheral vascular disease) ([place])   . Tubular adenoma of colon    history of     Past Surgical History:  Procedure Laterality Date  . CARDIOVERSION N/A 01/03/2019   Procedure: CARDIOVERSION;  Surgeon: Sanda Klein, MD;  Location: [place];  Service: Cardiovascular;  Laterality: N/A;  . CORONARY ARTERY BYPASS GRAFT  2000  . INSERTION PROSTATE RADIATION SEED  2008    Current Medications: Current  Meds  Medication Sig  . apixaban (ELIQUIS) 5 MG TABS tablet Take 1 tablet (5 mg total) by mouth 2 (two) times daily.  Marland Kitchen atorvastatin (LIPITOR) 20 MG tablet Take 20 mg by mouth daily.  . Calcium Carb-Cholecalciferol (CALCIUM 1000 + D PO) Take 1 tablet by mouth daily after breakfast.   . fish oil-omega-3 fatty acids 1000 MG capsule Take 1,000 mg by mouth daily after breakfast.   . furosemide (LASIX) 20 MG tablet Take 1 tablet (20 mg total) by mouth daily.  . Ginkgo Biloba 120 MG CAPS Take 120 mg by mouth every evening.  Donnie Aho (GLUCOSAMINE MSM COMPLEX PO) Take 1 tablet by mouth daily.   . hydroxypropyl methylcellulose / hypromellose (ISOPTO TEARS / GONIOVISC) 2.5 % ophthalmic solution Place 1 drop into both eyes 3 (three) times daily as needed for dry eyes.  . polyethylene glycol (MIRALAX / GLYCOLAX) 17 g packet Take 17 g by mouth as needed.  . Tamsulosin HCl (FLOMAX) 0.4 MG CAPS Take 0.4 mg by mouth every evening.   . Wheat Dextrin (BENEFIBER) POWD Take 2 Doses/Fill by mouth daily. Take 2 teaspoons by mouth daily   . [DISCONTINUED] apixaban (ELIQUIS) 5 MG TABS tablet Take 1 tablet (5 mg total) by mouth 2 (two) times daily.  . [DISCONTINUED] apixaban (ELIQUIS) 5 MG  TABS tablet Take 1 tablet (5 mg total) by mouth 2 (two) times daily.     Allergies:   Beta adrenergic blockers   Social History   Socioeconomic History  . Marital status: Married    Spouse name: Not on file  . Number of children: Not on file  . Years of education: Not on file  . Highest education level: Not on file  Occupational History  . Not on file  Social Needs  . Financial resource strain: Not on file  . Food insecurity    Worry: Not on file    Inability: Not on file  . Transportation needs    Medical: Not on file    Non-medical: Not on file  Tobacco Use  . Smoking status: Former Research scientist (life sciences)  . Smokeless tobacco: Never Used  Substance and Sexual Activity  . Alcohol use: No  . Drug use: No   . Sexual activity: Not on file  Lifestyle  . Physical activity    Days per week: Not on file    Minutes per session: Not on file  . Stress: Not on file  Relationships  . Social Herbalist on phone: Not on file    Gets together: Not on file    Attends religious service: Not on file    Active member of club or organization: Not on file    Attends meetings of clubs or organizations: Not on file    Relationship status: Not on file  Other Topics Concern  . Not on file  Social History Narrative  . Not on file     Family History: The patient's family history includes Breast cancer in his sister; Congestive Heart Failure in his father; Diabetes in his cousin and another family member; Heart disease in his father and other family members; Prostate cancer in an other family member; Stomach cancer in his cousin. There is no history of Colon cancer.  ROS:   Please see the history of present illness.   All other systems reviewed and are negative.  EKGs/Labs/Other Studies Reviewed:    The following studies were reviewed today: DCCV 01/03/2019  EKG:  EKG is ordered today.  The ekg ordered today demonstrates AF- 54.  Q in V2  Recent Labs: 11/24/2018: TSH 2.110 12/29/2018: BUN 31; Creatinine, Ser 1.42; Hemoglobin 12.7; Platelets 112; Potassium 4.4; Sodium 142  Recent Lipid Panel    Component Value Date/Time   CHOL 108 05/12/2007 1524   TRIG 122 05/12/2007 1524   HDL 34.8 (L) 05/12/2007 1524   CHOLHDL 3.1 CALC 05/12/2007 1524   VLDL 24 05/12/2007 1524   LDLCALC 49 05/12/2007 1524    Physical Exam:    VS:  BP 128/72   Pulse (!) 54   Temp (!) 97.3 F (36.3 C)   Ht 5\' 10"  (1.778 m)   Wt 163 lb (73.9 kg)   SpO2 95%   BMI 23.39 kg/m     Wt Readings from Last 3 Encounters:  01/26/19 163 lb (73.9 kg)  01/03/19 165 lb (74.8 kg)  12/29/18 164 lb 6.4 oz (74.6 kg)     GEN: Elderly thin Caucasian male, Well nourished, well developed in no acute distress HEENT: Normal,   NECK: No JVD; Left Carotid bruit LYMPHATICS: No lymphadenopathy CARDIAC: irregularly irregualr, no murmurs, rubs, gallops RESPIRATORY:  Clear to auscultation without rales, wheezing or rhonchi  ABDOMEN: Soft, non-tender, non-distended MUSCULOSKELETAL:  No edema; No deformity  SKIN: Warm and dry NEUROLOGIC:  Alert and oriented  x 3 PSYCHIATRIC:  Normal affect   ASSESSMENT:    PAF (paroxysmal atrial fibrillation) [facility]) New diagnosis June 2020- s/p DCCV Aug 2020- failed to convert to NSR  Chronic anticoagulation Eliquis- CHADS VASC=5  Hx of CABG CABG in '00, cath '08  Essential hypertension Echo Feb 2020- EF > 65%, severe LAE  Spinal stenosis Followed by Dr Ramos-chronic LLE numbness and weakness  Chronic renal disease, stage III ([place]) GFR 40's  PLAN:    I doubt he would benefit from Amiodarone and attempt at NSR restoration.  He has a follow up with Dr Stanford Breed in October and will keep that.  No change in current medical Rx.  If Eliquis needs to be held for spinal stenosis procedure this should be fine as he never converted to NSR.    Medication Adjustments/Labs and Tests Ordered: Current medicines are reviewed at length with the patient today.  Concerns regarding medicines are outlined above.  Orders Placed This Encounter  Procedures  . EKG 12-Lead   Meds ordered this encounter  Medications  . DISCONTD: apixaban (ELIQUIS) 5 MG TABS tablet    Sig: Take 1 tablet (5 mg total) by mouth 2 (two) times daily.    Dispense:  180 tablet    Refill:  3    NEW QUANTITY  . apixaban (ELIQUIS) 5 MG TABS tablet    Sig: Take 1 tablet (5 mg total) by mouth 2 (two) times daily.    Dispense:  180 tablet    Refill:  3    NEW QUANTITY    Patient Instructions  Medication Instructions:  Your physician recommends that you continue on your current medications as directed. Please refer to the Current Medication list given to you today. If you need a refill on your cardiac medications  before your next appointment, please call your pharmacy.   Lab work: None  If you have labs (blood work) drawn today and your tests are completely normal, you will receive your results only by: Marland Kitchen MyChart Message (if you have MyChart) OR . A paper copy in the mail If you have any lab test that is abnormal or we need to change your treatment, we will call you to review the results.  Testing/Procedures: None   Follow-Up: At [facility], you and your health needs are our priority.  As part of our continuing mission to provide you with exceptional heart care, we have created designated Provider Care Teams.  These Care Teams include your primary Cardiologist (physician) and Advanced Practice Providers (APPs -  Physician Assistants and Nurse Practitioners) who all work together to provide you with the care you need, when you need it. . FOLLOW UP WITH DR Stanford Breed AS SCHEDULED   Any Other Special Instructions Will Be Listed Below (If Applicable).      Signed, Kerin Ransom, PA-C  01/26/2019 2:39 PM    [facility]

## 2019-01-26 NOTE — Assessment & Plan Note (Signed)
Eliquis- CHADS VASC=5 

## 2019-01-26 NOTE — Assessment & Plan Note (Signed)
Followed by Dr Ramos-chronic LLE numbness and weakness

## 2019-01-26 NOTE — Patient Instructions (Signed)
Medication Instructions:  Your physician recommends that you continue on your current medications as directed. Please refer to the Current Medication list given to you today. If you need a refill on your cardiac medications before your next appointment, please call your pharmacy.   Lab work: None  If you have labs (blood work) drawn today and your tests are completely normal, you will receive your results only by: Marland Kitchen MyChart Message (if you have MyChart) OR . A paper copy in the mail If you have any lab test that is abnormal or we need to change your treatment, we will call you to review the results.  Testing/Procedures: None   Follow-Up: At [facility], you and your health needs are our priority.  As part of our continuing mission to provide you with exceptional heart care, we have created designated Provider Care Teams.  These Care Teams include your primary Cardiologist (physician) and Advanced Practice Providers (APPs -  Physician Assistants and Nurse Practitioners) who all work together to provide you with the care you need, when you need it. . FOLLOW UP WITH DR Stanford Breed AS SCHEDULED   Any Other Special Instructions Will Be Listed Below (If Applicable).

## 2019-01-26 NOTE — Assessment & Plan Note (Signed)
GFR 40's 

## 2019-01-26 NOTE — Assessment & Plan Note (Signed)
New diagnosis June 2020- s/p DCCV Aug 2020- failed to convert to NSR

## 2019-01-26 NOTE — Assessment & Plan Note (Signed)
Echo Feb 2020- EF > 65%, severe LAE

## 2019-01-26 NOTE — Assessment & Plan Note (Signed)
CABG in '00, cath '08

## 2019-02-15 ENCOUNTER — Other Ambulatory Visit: Payer: Self-pay | Admitting: Cardiology

## 2019-02-15 ENCOUNTER — Encounter: Payer: Self-pay | Admitting: *Deleted

## 2019-02-15 NOTE — Telephone Encounter (Signed)
This encounter was created in error - please disregard.

## 2019-02-15 NOTE — Telephone Encounter (Signed)
° ° ° °  Patient states he did not get last order of medication. Patient declined to call [facility] himself.   1. Which medications need to be refilled? (please list name of each medication and dose if known) apixaban (ELIQUIS) 5 MG TABS tablet  2. Which pharmacy/location (including street and city if local pharmacy) is medication to be sent to?[facility] [place], [place]  3. Do they need a 30 day or 90 day supply? [place]

## 2019-02-16 MED ORDER — APIXABAN 5 MG PO TABS: 5.0000 mg | ORAL_TABLET | Freq: Two times a day (BID) | ORAL | 0 refills | Status: DC

## 2019-02-16 NOTE — Telephone Encounter (Signed)
40m 73.9kg Scr 1.42 12/29/18 Lovw/kilroy 01/26/19

## 2019-02-22 NOTE — Progress Notes (Signed)
HPI: FU coronary disease. He is status post bypass surgery in 2000. He also has mild-to-moderate carotid stenosis, hypertension, borderline DM2, hyperlipidemia and prostate cancer. He had a cardiac catheterization in May 2008 which showed stable revascularization. Carotid Dopplers in Oct 2015 showed no significant stenosis. Echo February 2020 showed normal LV function, moderate right ventricular enlargement reduced function, biatrial enlargement, moderate mitral regurgitation, moderate tricuspid regurgitation.  There was mention that patient was in atrial fibrillation.  Patient was documented to be in atrial fibrillation June 2020.  We started apixaban and cardioversion was attempted but was unsuccessful. Since last seen,patient denies dyspnea, chest pain, palpitations or syncope.  No bleeding.  Some fatigue.  Current Outpatient Medications  Medication Sig Dispense Refill  . apixaban (ELIQUIS) 5 MG TABS tablet Take 1 tablet (5 mg total) by mouth 2 (two) times daily. 180 tablet 0  . atorvastatin (LIPITOR) 20 MG tablet Take 20 mg by mouth daily.    . Calcium Carb-Cholecalciferol (CALCIUM 1000 + D PO) Take 1 tablet by mouth daily after breakfast.     . fish oil-omega-3 fatty acids 1000 MG capsule Take 1,000 mg by mouth daily after breakfast.     . furosemide (LASIX) 20 MG tablet Take 1 tablet (20 mg total) by mouth daily. 90 tablet 3  . Ginkgo Biloba 120 MG CAPS Take 120 mg by mouth every evening.    Allen Anderson (GLUCOSAMINE MSM COMPLEX PO) Take 1 tablet by mouth daily.     . hydroxypropyl methylcellulose / hypromellose (ISOPTO TEARS / GONIOVISC) 2.5 % ophthalmic solution Place 1 drop into both eyes 3 (three) times daily as needed for dry eyes.    . polyethylene glycol (MIRALAX / GLYCOLAX) 17 g packet Take 17 g by mouth as needed.    . Tamsulosin HCl (FLOMAX) 0.4 MG CAPS Take 0.4 mg by mouth every evening.     . Wheat Dextrin (BENEFIBER) POWD Take 2 Doses/Fill by mouth  daily. Take 2 teaspoons by mouth daily      No current facility-administered medications for this visit.      Past Medical History:  Diagnosis Date  . Carotid stenosis    10/2008: R 0-39% L 40-59%     07/2009: L ICA < 50%  L ECA high-grade  R mild plaque  . Coronary artery disease    cath may 2008; stable  . Diabetes mellitus type 2, diet-controlled ([place])   . Hearing loss   . History of colonic polyps   . History of prostate cancer   . Hyperlipidemia   . Hypertension   . Osteoarthritis   . PVD (peripheral vascular disease) ([place])   . Tubular adenoma of colon    history of     Past Surgical History:  Procedure Laterality Date  . CARDIOVERSION N/A 01/03/2019   Procedure: CARDIOVERSION;  Surgeon: Sanda Klein, MD;  Location: [place];  Service: Cardiovascular;  Laterality: N/A;  . CORONARY ARTERY BYPASS GRAFT  2000  . INSERTION PROSTATE RADIATION SEED  2008    Social History   Socioeconomic History  . Marital status: Married    Spouse name: Not on file  . Number of children: Not on file  . Years of education: Not on file  . Highest education level: Not on file  Occupational History  . Not on file  Social Needs  . Financial resource strain: Not on file  . Food insecurity    Worry: Not on file    Inability: Not on  file  . Transportation needs    Medical: Not on file    Non-medical: Not on file  Tobacco Use  . Smoking status: Former Research scientist (life sciences)  . Smokeless tobacco: Never Used  Substance and Sexual Activity  . Alcohol use: No  . Drug use: No  . Sexual activity: Not on file  Lifestyle  . Physical activity    Days per week: Not on file    Minutes per session: Not on file  . Stress: Not on file  Relationships  . Social Herbalist on phone: Not on file    Gets together: Not on file    Attends religious service: Not on file    Active member of club or organization: Not on file    Attends meetings of clubs or organizations: Not on file    Relationship  status: Not on file  . Intimate partner violence    Fear of current or ex partner: Not on file    Emotionally abused: Not on file    Physically abused: Not on file    Forced sexual activity: Not on file  Other Topics Concern  . Not on file  Social History Narrative  . Not on file    Family History  Problem Relation Age of Onset  . Congestive Heart Failure Father   . Heart disease Father   . Breast cancer Sister   . Stomach cancer Cousin   . Diabetes Cousin   . Diabetes Other   . Heart disease Other   . Prostate cancer Other   . Heart disease Other   . Colon cancer Neg Hx     ROS: Back pain but no fevers or chills, productive cough, hemoptysis, dysphasia, odynophagia, melena, hematochezia, dysuria, hematuria, rash, seizure activity, orthopnea, PND, claudication. Remaining systems are negative.  Physical Exam: Well-developed well-nourished in no acute distress.  Skin is warm and dry.  HEENT is normal.  Neck is supple.  Chest is clear to auscultation with normal expansion.  Cardiovascular exam is irregular and bradycardic Abdominal exam nontender or distended. No masses palpated. Extremities show 1+ edema. neuro grossly intact  ECG-atrial fibrillation at a rate of 48, septal infarct, inferior infarct.  Personally reviewed  A/P  1 persistent atrial fibrillation-attempt at cardioversion was unsuccessful.  We will therefore plan rate control and anticoagulation.  Continue apixaban at present dose; check creatinine.  If 1.5 or greater would need to decrease apixaban to 2.5 mg twice daily.  Rate is controlled on no medications.    2 coronary artery disease s/p CABG-no chest pain.  Continue statin.  No aspirin given need for apixaban.  3 moderate mitral and tricuspid regurgitation-conservative measures given patient's age.  4 hypertension-blood pressure is controlled.  Continue present medications.  5 hyperlipidemia-continue statin.  6 carotid artery disease-mild on most  recent Dopplers.  Continue statin.  Kirk Ruths, MD

## 2019-02-23 ENCOUNTER — Ambulatory Visit (INDEPENDENT_AMBULATORY_CARE_PROVIDER_SITE_OTHER): Payer: Medicare Other | Admitting: Cardiology

## 2019-02-23 ENCOUNTER — Encounter: Payer: Self-pay | Admitting: Cardiology

## 2019-02-23 ENCOUNTER — Other Ambulatory Visit: Payer: Self-pay

## 2019-02-23 VITALS — BP 132/74 | HR 45 | Temp 98.4°F | Ht 70.0 in | Wt 167.1 lb

## 2019-02-23 DIAGNOSIS — I6523 Occlusion and stenosis of bilateral carotid arteries: Secondary | ICD-10-CM | POA: Diagnosis not present

## 2019-02-23 DIAGNOSIS — I10 Essential (primary) hypertension: Secondary | ICD-10-CM | POA: Diagnosis not present

## 2019-02-23 DIAGNOSIS — I480 Paroxysmal atrial fibrillation: Secondary | ICD-10-CM

## 2019-02-23 DIAGNOSIS — E785 Hyperlipidemia, unspecified: Secondary | ICD-10-CM | POA: Diagnosis not present

## 2019-02-23 DIAGNOSIS — Z951 Presence of aortocoronary bypass graft: Secondary | ICD-10-CM | POA: Diagnosis not present

## 2019-02-23 NOTE — Patient Instructions (Signed)
Medication Instructions:  NO CHANGE If you need a refill on your cardiac medications before your next appointment, please call your pharmacy.   Lab work: Your physician recommends that you HAVE LAB WORK TODAY  If you have labs (blood work) drawn today and your tests are completely normal, you will receive your results only by: . MyChart Message (if you have MyChart) OR . A paper copy in the mail If you have any lab test that is abnormal or we need to change your treatment, we will call you to review the results.  Follow-Up: At [facility], you and your health needs are our priority.  As part of our continuing mission to provide you with exceptional heart care, we have created designated Provider Care Teams.  These Care Teams include your primary Cardiologist (physician) and Advanced Practice Providers (APPs -  Physician Assistants and Nurse Practitioners) who all work together to provide you with the care you need, when you need it. Your physician recommends that you schedule a follow-up appointment in: 3 MONTHS WITH DR CRENSHAW      

## 2019-02-24 ENCOUNTER — Encounter: Payer: Self-pay | Admitting: *Deleted

## 2019-02-24 ENCOUNTER — Ambulatory Visit: Payer: Medicare Other | Admitting: Cardiology

## 2019-02-24 LAB — BASIC METABOLIC PANEL
BUN/Creatinine Ratio: 16 (ref 10–24)
BUN: 21 mg/dL (ref 8–27)
CO2: 24 mmol/L (ref 20–29)
Calcium: 9.7 mg/dL (ref 8.6–10.2)
Chloride: 102 mmol/L (ref 96–106)
Creatinine, Ser: 1.33 mg/dL — ABNORMAL HIGH (ref 0.76–1.27)
GFR calc Af Amer: 55 mL/min/{1.73_m2} — ABNORMAL LOW (ref >59)
GFR calc non Af Amer: 47 mL/min/{1.73_m2} — ABNORMAL LOW (ref >59)
Glucose: 89 mg/dL (ref 65–99)
Potassium: 4.4 mmol/L (ref 3.5–5.2)
Sodium: 141 mmol/L (ref 134–144)

## 2019-04-25 ENCOUNTER — Telehealth: Payer: Self-pay | Admitting: Cardiology

## 2019-04-25 NOTE — Telephone Encounter (Signed)
New Message     Pt is calling and says his wife has an appt today 11/30 with Dr Stanford Breed and would like to be seen with her.  He says they always go in together for their check ups.  I advised him they both have an appt 12/21 and he says he is aware of that appt but would like to be seen with his wife who is scheduled today at 240     Please call

## 2019-04-25 NOTE — Telephone Encounter (Signed)
Called patient back- he advised that he wanted to be seen today with his wife. I advised patient that we only had his wife for an appointment and his was the 21st, for documentation purposes and insurance purposes he would need an appointment for Dr.Crenshaw to make any adjustments to medications or issues- he can not come with his wife to her appointment and be seen at the same time. Patient verbalized understanding.

## 2019-05-11 NOTE — Progress Notes (Signed)
HPI: FU coronary disease. He is status post bypass surgery in 2000. He also has mild-to-moderate carotid stenosis, hypertension, borderline DM2, hyperlipidemia and prostate cancer. He had a cardiac catheterization in May 2008 which showed stable revascularization. Carotid Dopplers in Oct 2015 showed no significant stenosis.EchoFebruary 2020 showed normal LV function, moderate right ventricular enlargement reduced function, biatrial enlargement, moderate mitral regurgitation, moderate tricuspid regurgitation. There was mention that patient was in atrial fibrillation.  Patient was documented to be in atrial fibrillation June 2020.  We started apixaban and cardioversion was attempted but was unsuccessful.Since last seen,patient notes dyspnea on exertion and pedal edema.  He denies chest pain, palpitations or syncope.  Current Outpatient Medications  Medication Sig Dispense Refill  . apixaban (ELIQUIS) 5 MG TABS tablet Take 1 tablet (5 mg total) by mouth 2 (two) times daily. 180 tablet 0  . atorvastatin (LIPITOR) 20 MG tablet Take 20 mg by mouth daily.    . Calcium Carb-Cholecalciferol (CALCIUM 1000 + D PO) Take 1 tablet by mouth daily after breakfast.     . fish oil-omega-3 fatty acids 1000 MG capsule Take 1,000 mg by mouth daily after breakfast.     . furosemide (LASIX) 20 MG tablet Take 1 tablet (20 mg total) by mouth daily. 90 tablet 3  . Ginkgo Biloba 120 MG CAPS Take 120 mg by mouth every evening.    Donnie Aho (GLUCOSAMINE MSM COMPLEX PO) Take 1 tablet by mouth daily.     . hydroxypropyl methylcellulose / hypromellose (ISOPTO TEARS / GONIOVISC) 2.5 % ophthalmic solution Place 1 drop into both eyes 3 (three) times daily as needed for dry eyes.    . polyethylene glycol (MIRALAX / GLYCOLAX) 17 g packet Take 17 g by mouth as needed.    . Tamsulosin HCl (FLOMAX) 0.4 MG CAPS Take 0.4 mg by mouth every evening.     . Wheat Dextrin (BENEFIBER) POWD Take 2 Doses/Fill by  mouth daily. Take 2 teaspoons by mouth daily      No current facility-administered medications for this visit.     Past Medical History:  Diagnosis Date  . Carotid stenosis    10/2008: R 0-39% L 40-59%     07/2009: L ICA < 50%  L ECA high-grade  R mild plaque  . Coronary artery disease    cath may 2008; stable  . Diabetes mellitus type 2, diet-controlled ([place])   . Hearing loss   . History of colonic polyps   . History of prostate cancer   . Hyperlipidemia   . Hypertension   . Osteoarthritis   . PVD (peripheral vascular disease) ([place])   . Tubular adenoma of colon    history of     Past Surgical History:  Procedure Laterality Date  . CARDIOVERSION N/A 01/03/2019   Procedure: CARDIOVERSION;  Surgeon: Sanda Klein, MD;  Location: [facility];  Service: Cardiovascular;  Laterality: N/A;  . CORONARY ARTERY BYPASS GRAFT  2000  . INSERTION PROSTATE RADIATION SEED  2008    Social History   Socioeconomic History  . Marital status: Married    Spouse name: Not on file  . Number of children: Not on file  . Years of education: Not on file  . Highest education level: Not on file  Occupational History  . Not on file  Tobacco Use  . Smoking status: Former Research scientist (life sciences)  . Smokeless tobacco: Never Used  Substance and Sexual Activity  . Alcohol use: No  . Drug use: No  .  Sexual activity: Not on file  Other Topics Concern  . Not on file  Social History Narrative  . Not on file   Social Determinants of Health   Financial Resource Strain:   . Difficulty of Paying Living Expenses: Not on file  Food Insecurity:   . Worried About Charity fundraiser in the Last Year: Not on file  . Ran Out of Food in the Last Year: Not on file  Transportation Needs:   . Lack of Transportation (Medical): Not on file  . Lack of Transportation (Non-Medical): Not on file  Physical Activity:   . Days of Exercise per Week: Not on file  . Minutes of Exercise per Session: Not on file  Stress:   . Feeling  of Stress : Not on file  Social Connections:   . Frequency of Communication with Friends and Family: Not on file  . Frequency of Social Gatherings with Friends and Family: Not on file  . Attends Religious Services: Not on file  . Active Member of Clubs or Organizations: Not on file  . Attends Archivist Meetings: Not on file  . Marital Status: Not on file  Intimate Partner Violence:   . Fear of Current or Ex-Partner: Not on file  . Emotionally Abused: Not on file  . Physically Abused: Not on file  . Sexually Abused: Not on file    Family History  Problem Relation Age of Onset  . Congestive Heart Failure Father   . Heart disease Father   . Breast cancer Sister   . Stomach cancer Cousin   . Diabetes Cousin   . Diabetes Other   . Heart disease Other   . Prostate cancer Other   . Heart disease Other   . Colon cancer Neg Hx     ROS: Back pain but no fevers or chills, productive cough, hemoptysis, dysphasia, odynophagia, melena, hematochezia, dysuria, hematuria, rash, seizure activity, orthopnea, PND, claudication. Remaining systems are negative.  Physical Exam: Well-developed elderly in no acute distress.  Skin is warm and dry.  HEENT is normal.  Neck is supple.  Chest is clear to auscultation with normal expansion.  Cardiovascular exam is regular rate and rhythm.  Abdominal exam nontender or distended. No masses palpated. Extremities show trace to 1+ edema. neuro grossly intact  A/P  1 permanent atrial fibrillation-previous attempt at cardioversion was unsuccessful.  We will therefore plan rate control and anticoagulation.  As outlined previously his rate is controlled on no medications.  Continue apixaban.  2 coronary artery disease status post coronary artery bypass graft-patient has not had chest pain.  Continue statin.  He is not on aspirin given need for apixaban.  3 hypertension-patient's blood pressure is controlled.  Continue present medications and  follow.  4 hyperlipidemia-continue statin.  5 carotid artery disease-mild on most recent Dopplers.  6 moderate mitral and tricuspid regurgitation-we will recheck echocardiogram February 2021.  We would like to be conservative given patient's overall medical condition and age.  7 chronic diastolic congestive heart failure-patient describes some increased dyspnea and pedal edema.  Increase Lasix to 40 mg daily.  Check potassium and renal function in 1 week.  Kirk Ruths, MD

## 2019-05-16 ENCOUNTER — Other Ambulatory Visit: Payer: Self-pay

## 2019-05-16 ENCOUNTER — Encounter: Payer: Self-pay | Admitting: Cardiology

## 2019-05-16 ENCOUNTER — Ambulatory Visit (INDEPENDENT_AMBULATORY_CARE_PROVIDER_SITE_OTHER): Payer: Medicare Other | Admitting: Cardiology

## 2019-05-16 VITALS — BP 138/66 | HR 60 | Temp 97.3°F | Ht 70.0 in | Wt 168.0 lb

## 2019-05-16 DIAGNOSIS — E785 Hyperlipidemia, unspecified: Secondary | ICD-10-CM

## 2019-05-16 DIAGNOSIS — I4821 Permanent atrial fibrillation: Secondary | ICD-10-CM

## 2019-05-16 DIAGNOSIS — Z951 Presence of aortocoronary bypass graft: Secondary | ICD-10-CM

## 2019-05-16 DIAGNOSIS — I059 Rheumatic mitral valve disease, unspecified: Secondary | ICD-10-CM

## 2019-05-16 DIAGNOSIS — I10 Essential (primary) hypertension: Secondary | ICD-10-CM

## 2019-05-16 DIAGNOSIS — I4819 Other persistent atrial fibrillation: Secondary | ICD-10-CM

## 2019-05-16 DIAGNOSIS — I6523 Occlusion and stenosis of bilateral carotid arteries: Secondary | ICD-10-CM | POA: Diagnosis not present

## 2019-05-16 MED ORDER — FUROSEMIDE 20 MG PO TABS: 40.0000 mg | ORAL_TABLET | Freq: Every day | ORAL | 3 refills | Status: DC

## 2019-05-16 NOTE — Patient Instructions (Signed)
Medication Instructions: INCREASE LASIX TO 40MG  DAILY *If you need a refill on your cardiac medications before your next appointment, please call your pharmacy*  Lab Work: Your physician recommends that you return for lab work in [place] (CBC, BMP)  If you have labs (blood work) drawn today and your tests are completely normal, you will receive your results only by: Marland Kitchen MyChart Message (if you have MyChart) OR . A paper copy in the mail If you have any lab test that is abnormal or we need to change your treatment, we will call you to review the results.  Testing/Procedures: Your physician has requested that you have an echocardiogram. Echocardiography is a painless test that uses sound waves to create images of your heart. It provides your doctor with information about the size and shape of your heart and how well your heart's chambers and valves are working. This procedure takes approximately one hour. There are no restrictions for this procedure.  [place]  Follow-Up: At [facility], you and your health needs are our priority.  As part of our continuing mission to provide you with exceptional heart care, we have created designated Provider Care Teams.  These Care Teams include your primary Cardiologist (physician) and Advanced Practice Providers (APPs -  Physician Assistants and Nurse Practitioners) who all work together to provide you with the care you need, when you need it.  Your next appointment:   3 month(s)  The format for your next appointment:   In Person  Provider:   Kirk Ruths, MD

## 2019-05-21 ENCOUNTER — Other Ambulatory Visit: Payer: Self-pay | Admitting: Cardiology

## 2019-07-11 ENCOUNTER — Ambulatory Visit (HOSPITAL_COMMUNITY): Payer: Medicare Other | Attending: Cardiology

## 2019-07-11 ENCOUNTER — Other Ambulatory Visit: Payer: Self-pay

## 2019-07-11 DIAGNOSIS — I059 Rheumatic mitral valve disease, unspecified: Secondary | ICD-10-CM | POA: Diagnosis present

## 2019-08-02 NOTE — Progress Notes (Signed)
HPI: FU coronary disease and CHF. He is status post bypass surgery in 2000. He also has mild-to-moderate carotid stenosis, hypertension, borderline DM2, hyperlipidemia and prostate cancer. He had a cardiac catheterization in May 2008 which showed stable revascularization. Carotid Dopplers in Oct 2015 showed no significant stenosis.Also with h/o atrial fibrillation. We started apixaban and cardioversion was attempted but was unsuccessful. Echocardiogram repeated February 2021 and showed normal LV function, mild right ventricular enlargement, mild biatrial enlargement, mild mitral regurgitation.  Since last seen,patient has dyspnea and fatigue with activities that he attributes to back pain.  He denies chest pain, palpitations or syncope.  Current Outpatient Medications  Medication Sig Dispense Refill  . apixaban (ELIQUIS) 5 MG TABS tablet Take 1 tablet (5 mg total) by mouth 2 (two) times daily. 180 tablet 0  . atorvastatin (LIPITOR) 20 MG tablet Take 1 tablet (20 mg total) by mouth daily at 6 PM. 90 tablet 3  . Calcium Carb-Cholecalciferol (CALCIUM 1000 + D PO) Take 1 tablet by mouth daily after breakfast.     . fish oil-omega-3 fatty acids 1000 MG capsule Take 1,000 mg by mouth daily after breakfast.     . Ginkgo Biloba 120 MG CAPS Take 120 mg by mouth every evening.    Donnie Aho (GLUCOSAMINE MSM COMPLEX PO) Take 1 tablet by mouth daily.     . hydroxypropyl methylcellulose / hypromellose (ISOPTO TEARS / GONIOVISC) 2.5 % ophthalmic solution Place 1 drop into both eyes 3 (three) times daily as needed for dry eyes.    . polyethylene glycol (MIRALAX / GLYCOLAX) 17 g packet Take 17 g by mouth as needed.    . Tamsulosin HCl (FLOMAX) 0.4 MG CAPS Take 0.4 mg by mouth every evening.     . Wheat Dextrin (BENEFIBER) POWD Take 2 Doses/Fill by mouth daily. Take 2 teaspoons by mouth daily     . furosemide (LASIX) 20 MG tablet Take 2 tablets (40 mg total) by mouth daily. 90 tablet 3    No current facility-administered medications for this visit.     Past Medical History:  Diagnosis Date  . Carotid stenosis    10/2008: R 0-39% L 40-59%     07/2009: L ICA < 50%  L ECA high-grade  R mild plaque  . Coronary artery disease    cath may 2008; stable  . Diabetes mellitus type 2, diet-controlled ([place])   . Hearing loss   . History of colonic polyps   . History of prostate cancer   . Hyperlipidemia   . Hypertension   . Osteoarthritis   . PVD (peripheral vascular disease) ([place])   . Tubular adenoma of colon    history of     Past Surgical History:  Procedure Laterality Date  . CARDIOVERSION N/A 01/03/2019   Procedure: CARDIOVERSION;  Surgeon: Sanda Klein, MD;  Location: [facility];  Service: Cardiovascular;  Laterality: N/A;  . CORONARY ARTERY BYPASS GRAFT  2000  . INSERTION PROSTATE RADIATION SEED  2008    Social History   Socioeconomic History  . Marital status: Married    Spouse name: Not on file  . Number of children: Not on file  . Years of education: Not on file  . Highest education level: Not on file  Occupational History  . Not on file  Tobacco Use  . Smoking status: Former Research scientist (life sciences)  . Smokeless tobacco: Never Used  Substance and Sexual Activity  . Alcohol use: No  . Drug use: No  .  Sexual activity: Not on file  Other Topics Concern  . Not on file  Social History Narrative  . Not on file   Social Determinants of Health   Financial Resource Strain:   . Difficulty of Paying Living Expenses:   Food Insecurity:   . Worried About Charity fundraiser in the Last Year:   . Arboriculturist in the Last Year:   Transportation Needs:   . Film/video editor (Medical):   Marland Kitchen Lack of Transportation (Non-Medical):   Physical Activity:   . Days of Exercise per Week:   . Minutes of Exercise per Session:   Stress:   . Feeling of Stress :   Social Connections:   . Frequency of Communication with Friends and Family:   . Frequency of Social  Gatherings with Friends and Family:   . Attends Religious Services:   . Active Member of Clubs or Organizations:   . Attends Archivist Meetings:   Marland Kitchen Marital Status:   Intimate Partner Violence:   . Fear of Current or Ex-Partner:   . Emotionally Abused:   Marland Kitchen Physically Abused:   . Sexually Abused:     Family History  Problem Relation Age of Onset  . Congestive Heart Failure Father   . Heart disease Father   . Breast cancer Sister   . Stomach cancer Cousin   . Diabetes Cousin   . Diabetes Other   . Heart disease Other   . Prostate cancer Other   . Heart disease Other   . Colon cancer Neg Hx     ROS: no fevers or chills, productive cough, hemoptysis, dysphasia, odynophagia, melena, hematochezia, dysuria, hematuria, rash, seizure activity, orthopnea, PND, pedal edema, claudication. Remaining systems are negative.  Physical Exam: Well-developed well-nourished in no acute distress.  Skin is warm and dry.  HEENT is normal.  Neck is supple.  Chest is clear to auscultation with normal expansion.  Cardiovascular exam is irregular Abdominal exam nontender or distended. No masses palpated. Extremities show no edema. neuro grossly intact   A/P  1 permanent atrial fibrillation-previous cardioversion was unsuccessful.  Plan is rate control and anticoagulation.  Continue apixaban.  His heart rate is controlled on no medications.  Check renal function and hemoglobin.  2 coronary artery disease-patient denies chest pain.  Continue statin.  He is not on aspirin given need for anticoagulation.  3 hypertension-blood pressure elevated; I will add losartan 50 mg daily.  Check potassium and renal function in 1 week.  4 hyperlipidemia-continue statin.  Check lipids and liver.  5 carotid artery disease-mild on most recent Dopplers.  6 chronic diastolic congestive heart failure-patient appears to be euvolemic.  Continue Lasix at present dose.  7 mitral regurgitation/tricuspid  regurgitation-MR is mild on most recent echocardiogram.  Kirk Ruths, MD

## 2019-08-15 ENCOUNTER — Telehealth: Payer: Self-pay | Admitting: Cardiology

## 2019-08-15 ENCOUNTER — Other Ambulatory Visit: Payer: Self-pay

## 2019-08-15 ENCOUNTER — Ambulatory Visit (INDEPENDENT_AMBULATORY_CARE_PROVIDER_SITE_OTHER): Payer: Medicare Other | Admitting: Cardiology

## 2019-08-15 ENCOUNTER — Encounter: Payer: Self-pay | Admitting: Cardiology

## 2019-08-15 VITALS — BP 162/70 | HR 58 | Ht 70.0 in | Wt 168.0 lb

## 2019-08-15 DIAGNOSIS — I10 Essential (primary) hypertension: Secondary | ICD-10-CM | POA: Diagnosis not present

## 2019-08-15 DIAGNOSIS — Z951 Presence of aortocoronary bypass graft: Secondary | ICD-10-CM | POA: Diagnosis not present

## 2019-08-15 DIAGNOSIS — E785 Hyperlipidemia, unspecified: Secondary | ICD-10-CM | POA: Diagnosis not present

## 2019-08-15 DIAGNOSIS — I4821 Permanent atrial fibrillation: Secondary | ICD-10-CM

## 2019-08-15 MED ORDER — LOSARTAN POTASSIUM 50 MG PO TABS: 50.0000 mg | ORAL_TABLET | Freq: Every day | ORAL | 3 refills | Status: DC

## 2019-08-15 NOTE — Telephone Encounter (Signed)
Lab orders faxed to [name] made aware.  She also states his PCP is now Dr. Renold Don as well as her mothers.    Updated in epic.

## 2019-08-15 NOTE — Patient Instructions (Signed)
Medication Instructions:  START LOSARTAN 50 MG ONCE DAILY  *If you need a refill on your cardiac medications before your next appointment, please call your pharmacy*   Lab Work: Your physician recommends that you return for lab work in: [place]  If you have labs (blood work) drawn today and your tests are completely normal, you will receive your results only by: Marland Kitchen MyChart Message (if you have MyChart) OR . A paper copy in the mail If you have any lab test that is abnormal or we need to change your treatment, we will call you to review the results.   Follow-Up: At [facility], you and your health needs are our priority.  As part of our continuing mission to provide you with exceptional heart care, we have created designated Provider Care Teams.  These Care Teams include your primary Cardiologist (physician) and Advanced Practice Providers (APPs -  Physician Assistants and Nurse Practitioners) who all work together to provide you with the care you need, when you need it.  We recommend signing up for the patient portal called "MyChart".  Sign up information is provided on this After Visit Summary.  MyChart is used to connect with patients for Virtual Visits (Telemedicine).  Patients are able to view lab/test results, encounter notes, upcoming appointments, etc.  Non-urgent messages can be sent to your provider as well.   To learn more about what you can do with MyChart, go to [email or web address].    Your next appointment:   6 month(s)  The format for your next appointment:   Either In Person or Virtual  Provider:   You may see Kirk Ruths, MD or one of the following Advanced Practice Providers on your designated Care Team:    Kerin Ransom, PA-C  [place], [place]  Coletta Memos, [place]

## 2019-08-15 NOTE — Telephone Encounter (Signed)
[name] the patient's daughter is calling stating Avel is going to have his blood work performed at [facility] on Monday 08/22/19. She states when she spoke with the office to schedule this they advised her they would need to be faxed over what type of blood work Dr. Stanford Breed is requesting to have performed. The fax number to their office is [phone].

## 2019-09-26 ENCOUNTER — Encounter: Payer: Self-pay | Admitting: *Deleted

## 2019-11-10 IMAGING — CR DG CHEST 2V
2 series · 2 of 2 positions shown · non-contrast
Comparison: 10/14/2013

CLINICAL DATA: Shortness of breath for several weeks

EXAM:
CHEST - 2 VIEW

[w chest pa]
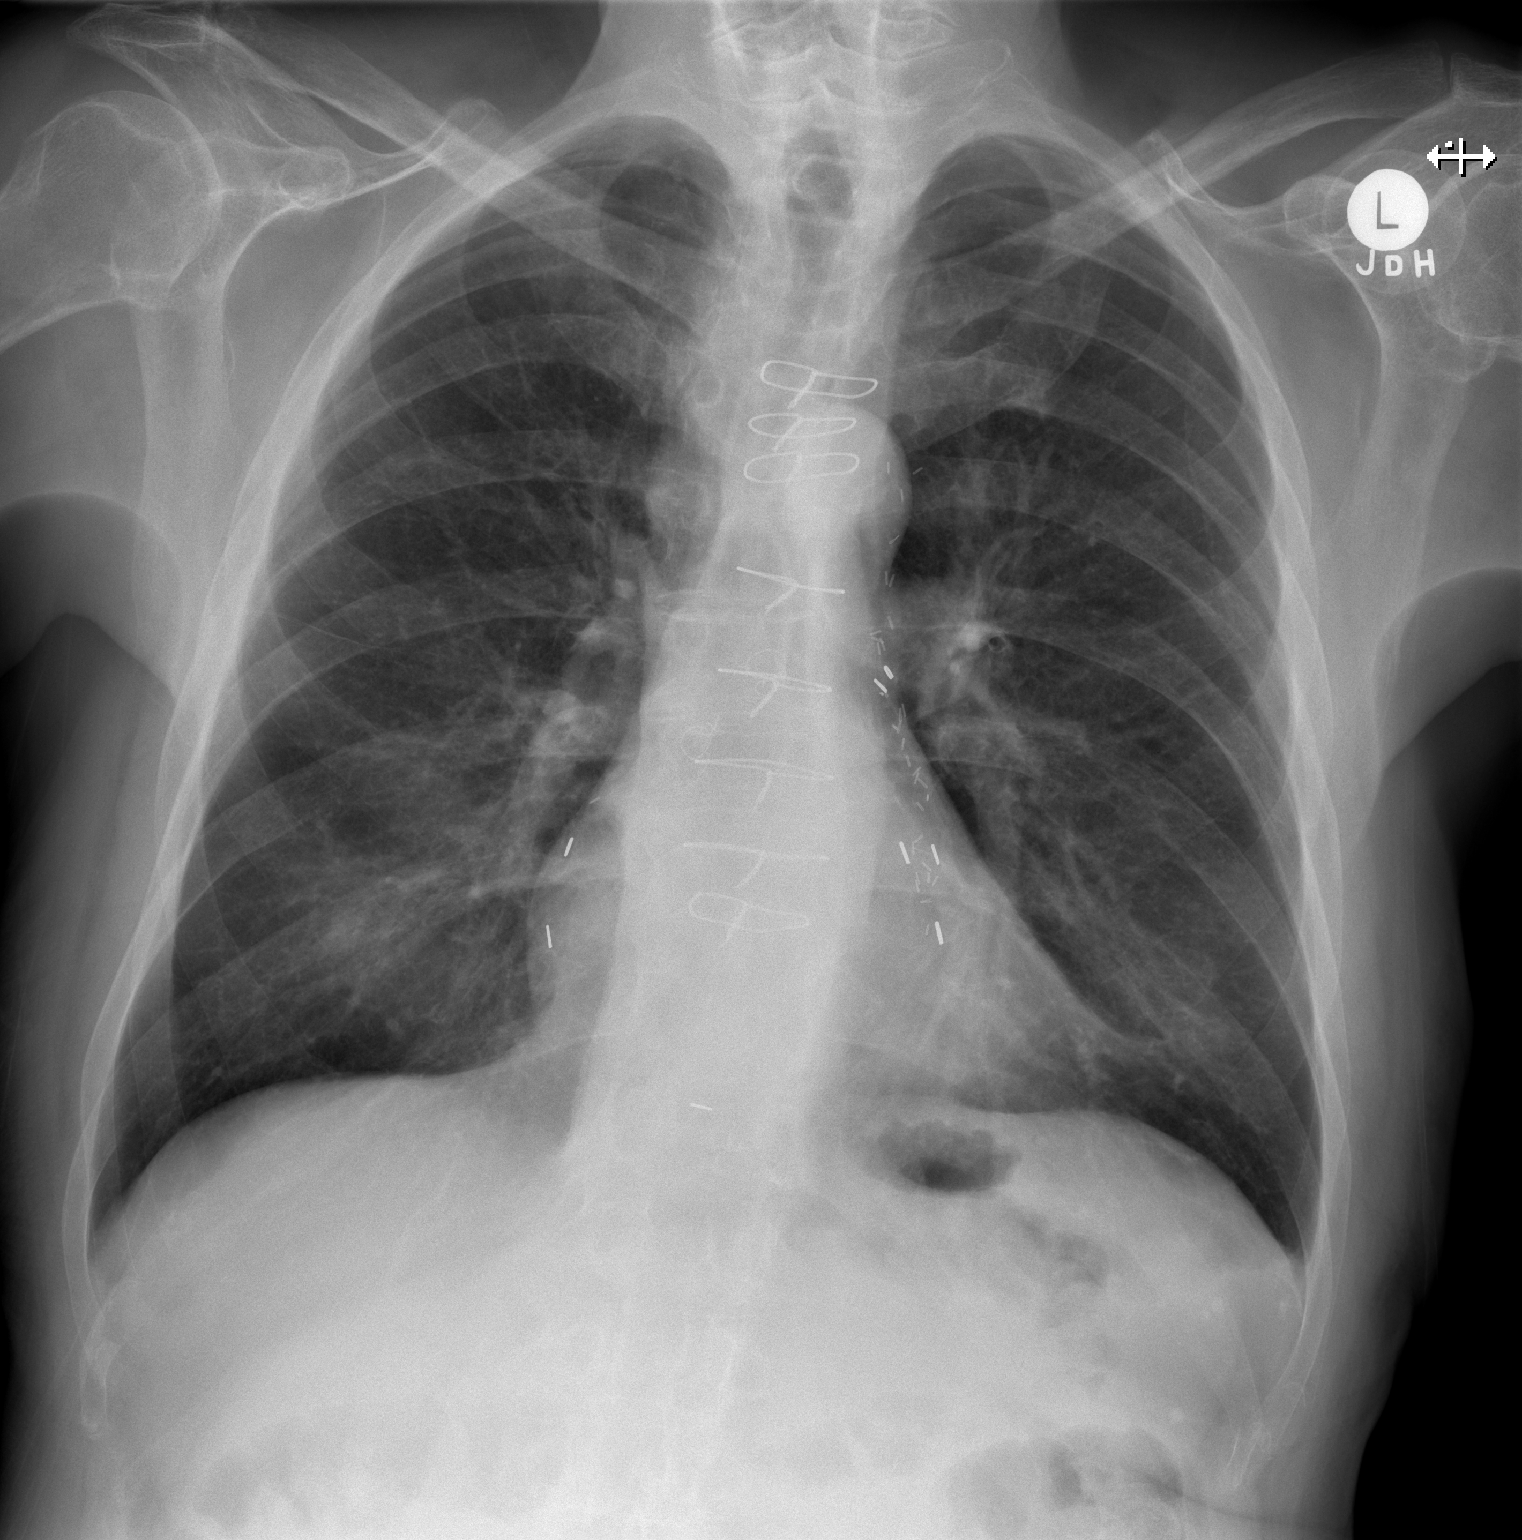

[w chest lat]
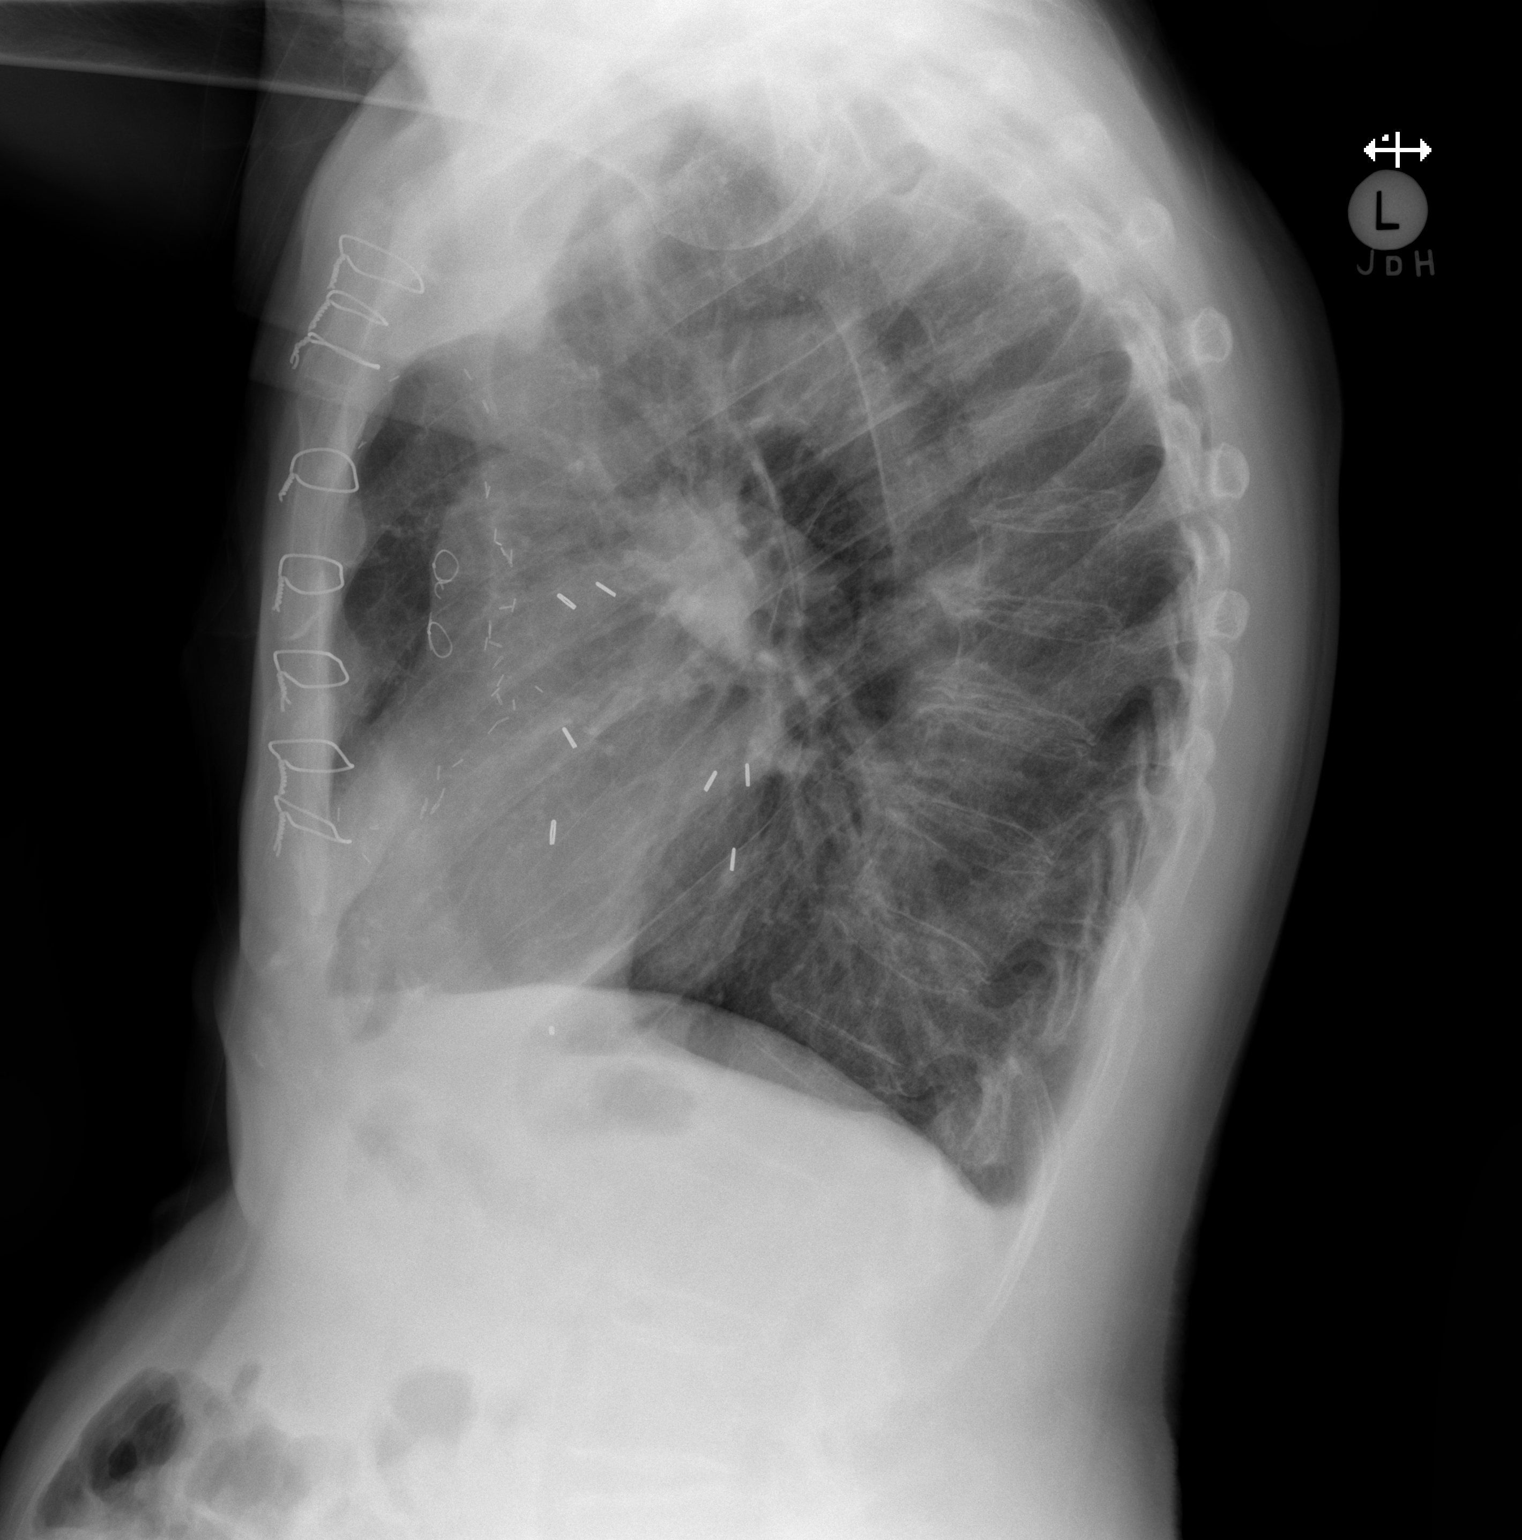

[2 of 2 positions shown; findings below may reference images not displayed]

FINDINGS: Cardiac shadows within normal limits. Postsurgical changes are again
seen. The lungs are hyper inflated bilaterally. No focal infiltrate
or sizable effusion is seen. No acute bony abnormality is noted.
IMPRESSION: COPD without acute abnormality.

## 2019-12-16 ENCOUNTER — Telehealth: Payer: Self-pay | Admitting: Cardiology

## 2019-12-16 NOTE — Telephone Encounter (Signed)
Pt's PCP called. The patient was seen at their practice by an NP today for  Fainting and near syncope. The PCP was hoping the patient could be seen ASAP. The PCP suggested putting this patient in the slot that his wife currently has to see Dr. Stanford Breed 01/02/20. Neither the patient nor the wife drive and rely on outside transportation  ?

## 2019-12-16 NOTE — Telephone Encounter (Signed)
Called and spoke with pt and pt's wife. Difficult to follow what the pt was saying. His wife states that he saw his PCP yesterday and almost passed out at the office. They told him he was dehydrated and to stop his lasix for now. They told him his blood sugar was fine but to watch his sweets intake.  Pt denies SOB or chest pain. Reports he was clammy yesterday and after the his appt with his pcp he went home and slept for a few hours (he is not sure if it is one of his medications making him tired) Wife states he just does not feel good.  His PCP would like the pt to be seen by Dr.Crenshaw. the wife states she has an appt on 01/02/20 at 10:40am and would like to know if they could be seen together due to transportation.  Notified I would send this message to [facility] and his nurse Hilda Blades to review and see if there was an appt available.  Notified that if the pt feels like he is going to pass out again, if he does actually pass out, if he begins having chest pain, or becomes SOB to go to the emergency room immediately to be evaluated. Wife verbalized understanding and was thankful for the call. No other questions at this time.

## 2019-12-17 NOTE — Telephone Encounter (Signed)
Ok to hold lasix; would arrange paov Kirk Ruths

## 2019-12-19 ENCOUNTER — Encounter: Payer: Self-pay | Admitting: Cardiology

## 2019-12-19 NOTE — Telephone Encounter (Signed)
New Message:   Please call, question about her Lasix. 

## 2019-12-19 NOTE — Telephone Encounter (Signed)
This encounter was created in error - please disregard.

## 2019-12-19 NOTE — Telephone Encounter (Signed)
Follow up scheduled

## 2019-12-27 NOTE — Progress Notes (Signed)
HPI: FU coronary disease, atrial fibrillation and CHF. He is status post bypass surgery in 2000. He also has mild-to-moderate carotid stenosis, hypertension, borderline DM2, hyperlipidemia and prostate cancer. He had a cardiac catheterization in May 2008 which showed stable revascularization. Carotid Dopplers in Oct 2015 showed no significant stenosis.Also with h/o atrial fibrillation. We started apixaban and cardioversion was attempted but was unsuccessful. Echocardiogram repeated February 2021 and showed normal LV function, mild right ventricular enlargement, mild biatrial enlargement, mild mitral regurgitation.  Since last seen,he has some dyspnea on exertion but no orthopnea or PND.  Chronic mild pedal edema.  He denies chest pain or syncope.  Current Outpatient Medications  Medication Sig Dispense Refill  . apixaban (ELIQUIS) 5 MG TABS tablet Take 1 tablet (5 mg total) by mouth 2 (two) times daily. 180 tablet 0  . atorvastatin (LIPITOR) 20 MG tablet Take 1 tablet (20 mg total) by mouth daily at 6 PM. 90 tablet 3  . Calcium Carb-Cholecalciferol (CALCIUM 1000 + D PO) Take 1 tablet by mouth daily after breakfast.     . fish oil-omega-3 fatty acids 1000 MG capsule Take 1,000 mg by mouth daily after breakfast.     . Ginkgo Biloba 120 MG CAPS Take 120 mg by mouth every evening.    Donnie Aho (GLUCOSAMINE MSM COMPLEX PO) Take 1 tablet by mouth daily.     . hydroxypropyl methylcellulose / hypromellose (ISOPTO TEARS / GONIOVISC) 2.5 % ophthalmic solution Place 1 drop into both eyes 3 (three) times daily as needed for dry eyes.    . polyethylene glycol (MIRALAX / GLYCOLAX) 17 g packet Take 17 g by mouth as needed.    . Tamsulosin HCl (FLOMAX) 0.4 MG CAPS Take 0.4 mg by mouth every evening.     . Wheat Dextrin (BENEFIBER) POWD Take 2 Doses/Fill by mouth daily. Take 2 teaspoons by mouth daily     . furosemide (LASIX) 20 MG tablet Take 2 tablets (40 mg total) by mouth daily.  90 tablet 3  . losartan (COZAAR) 50 MG tablet Take 1 tablet (50 mg total) by mouth daily. 90 tablet 3   No current facility-administered medications for this visit.     Past Medical History:  Diagnosis Date  . Carotid stenosis    10/2008: R 0-39% L 40-59%     07/2009: L ICA < 50%  L ECA high-grade  R mild plaque  . Coronary artery disease    cath may 2008; stable  . Diabetes mellitus type 2, diet-controlled ([place])   . Hearing loss   . History of colonic polyps   . History of prostate cancer   . Hyperlipidemia   . Hypertension   . Osteoarthritis   . PVD (peripheral vascular disease) ([place])   . Tubular adenoma of colon    history of     Past Surgical History:  Procedure Laterality Date  . CARDIOVERSION N/A 01/03/2019   Procedure: CARDIOVERSION;  Surgeon: Sanda Klein, MD;  Location: [facility];  Service: Cardiovascular;  Laterality: N/A;  . CORONARY ARTERY BYPASS GRAFT  2000  . INSERTION PROSTATE RADIATION SEED  2008    Social History   Socioeconomic History  . Marital status: Married    Spouse name: Not on file  . Number of children: Not on file  . Years of education: Not on file  . Highest education level: Not on file  Occupational History  . Not on file  Tobacco Use  . Smoking status: Former Research scientist (life sciences)  .  Smokeless tobacco: Never Used  Substance and Sexual Activity  . Alcohol use: No  . Drug use: No  . Sexual activity: Not on file  Other Topics Concern  . Not on file  Social History Narrative  . Not on file   Social Determinants of Health   Financial Resource Strain:   . Difficulty of Paying Living Expenses:   Food Insecurity:   . Worried About Charity fundraiser in the Last Year:   . Arboriculturist in the Last Year:   Transportation Needs:   . Film/video editor (Medical):   Marland Kitchen Lack of Transportation (Non-Medical):   Physical Activity:   . Days of Exercise per Week:   . Minutes of Exercise per Session:   Stress:   . Feeling of Stress :   Social  Connections:   . Frequency of Communication with Friends and Family:   . Frequency of Social Gatherings with Friends and Family:   . Attends Religious Services:   . Active Member of Clubs or Organizations:   . Attends Archivist Meetings:   Marland Kitchen Marital Status:   Intimate Partner Violence:   . Fear of Current or Ex-Partner:   . Emotionally Abused:   Marland Kitchen Physically Abused:   . Sexually Abused:     Family History  Problem Relation Age of Onset  . Congestive Heart Failure Father   . Heart disease Father   . Breast cancer Sister   . Stomach cancer Cousin   . Diabetes Cousin   . Diabetes Other   . Heart disease Other   . Prostate cancer Other   . Heart disease Other   . Colon cancer Neg Hx     ROS: no fevers or chills, productive cough, hemoptysis, dysphasia, odynophagia, melena, hematochezia, dysuria, hematuria, rash, seizure activity, orthopnea, PND, pedal edema, claudication. Remaining systems are negative.  Physical Exam: Well-developed well-nourished in no acute distress.  Skin is warm and dry.  HEENT is normal.  Neck is supple.  Chest is clear to auscultation with normal expansion.  Cardiovascular exam is irregular and bradycardic Abdominal exam nontender or distended. No masses palpated. Extremities show no edema. neuro grossly intact  ECG-atrial fibrillation with PVCs or aberrantly conducted beats, septal infarct, nonspecific ST changes.  Personally reviewed  A/P  1 permanent atrial fibrillation-as outlined previous cardioversion attempt was unsuccessful.  Therefore we will plan rate control.  Note his heart rate is controlled on no medications.  Continue apixaban.  2 coronary artery disease-he has not had recurrent chest pain.  Continue statin.  No aspirin given need for anticoagulation.  3 hypertension-blood pressure controlled.  Continue present medical regimen and follow.  4 hyperlipidemia-continue statin.  5 carotid artery disease-mild on most recent  Dopplers.  6 chronic diastolic congestive heart failure-patient appears to be euvolemic today on examination.  Continue Lasix at present dose.  I will have his most recent laboratories forwarded to [place] from Dr. Arelia Sneddon office.  7 mitral regurgitation-mild on most recent echocardiogram.  8 bradycardia-he is asymptomatic at present.  May need pacemaker in the future.  Kirk Ruths, MD

## 2020-01-02 ENCOUNTER — Other Ambulatory Visit: Payer: Self-pay

## 2020-01-02 ENCOUNTER — Ambulatory Visit (INDEPENDENT_AMBULATORY_CARE_PROVIDER_SITE_OTHER): Payer: Medicare Other | Admitting: Cardiology

## 2020-01-02 ENCOUNTER — Encounter: Payer: Self-pay | Admitting: Cardiology

## 2020-01-02 VITALS — BP 130/72 | HR 47 | Temp 97.0°F | Ht 70.0 in | Wt 165.0 lb

## 2020-01-02 DIAGNOSIS — I10 Essential (primary) hypertension: Secondary | ICD-10-CM

## 2020-01-02 DIAGNOSIS — Z951 Presence of aortocoronary bypass graft: Secondary | ICD-10-CM | POA: Diagnosis not present

## 2020-01-02 DIAGNOSIS — I4821 Permanent atrial fibrillation: Secondary | ICD-10-CM | POA: Diagnosis not present

## 2020-01-02 DIAGNOSIS — E785 Hyperlipidemia, unspecified: Secondary | ICD-10-CM

## 2020-01-02 NOTE — Patient Instructions (Signed)
Medication Instructions:  NO CHANGE *If you need a refill on your cardiac medications before your next appointment, please call your pharmacy*   Lab Work: If you have labs (blood work) drawn today and your tests are completely normal, you will receive your results only by: . MyChart Message (if you have MyChart) OR . A paper copy in the mail If you have any lab test that is abnormal or we need to change your treatment, we will call you to review the results.   Follow-Up: At [facility], you and your health needs are our priority.  As part of our continuing mission to provide you with exceptional heart care, we have created designated Provider Care Teams.  These Care Teams include your primary Cardiologist (physician) and Advanced Practice Providers (APPs -  Physician Assistants and Nurse Practitioners) who all work together to provide you with the care you need, when you need it.  We recommend signing up for the patient portal called "MyChart".  Sign up information is provided on this After Visit Summary.  MyChart is used to connect with patients for Virtual Visits (Telemedicine).  Patients are able to view lab/test results, encounter notes, upcoming appointments, etc.  Non-urgent messages can be sent to your provider as well.   To learn more about what you can do with MyChart, go to [email or web address].    Your next appointment:   3 month(s)  The format for your next appointment:   In Person  Provider:   Brian Crenshaw, MD    

## 2020-01-12 ENCOUNTER — Telehealth: Payer: Self-pay | Admitting: *Deleted

## 2020-01-12 DIAGNOSIS — R609 Edema, unspecified: Secondary | ICD-10-CM

## 2020-01-12 NOTE — Telephone Encounter (Signed)
Spoke with pt wife, we received labs work from his medical doctor from 12/15/19 and his BUN=49 and CR=2.16. according to the wife the patient was taken completely off of the furosemide. She reports he has some swelling in his feet today. Advised per dr Stanford Breed, patient to restart furosemide 20 mg once daily with lab work in [employer] week. Lab orders mailed to the pt

## 2020-01-20 NOTE — Telephone Encounter (Signed)
Pt called in to triage today and states that she was to come in for BMET yesterday and she is unable to come in until next week. Informed pt to come in as soon as she can.

## 2020-02-29 ENCOUNTER — Other Ambulatory Visit: Payer: Self-pay | Admitting: Cardiology

## 2020-03-08 ENCOUNTER — Ambulatory Visit: Payer: Medicare Other | Admitting: Cardiology

## 2020-03-27 NOTE — Progress Notes (Signed)
HPI: FU coronary disease, atrial fibrillationand CHF. He is status post bypass surgery in 2000. He also has mild-to-moderate carotid stenosis, hypertension, borderline DM2, hyperlipidemia and prostate cancer. He had a cardiac catheterization in May 2008 which showed stable revascularization. Carotid Dopplers in Oct 2015 showed no significant stenosis.Also with h/o atrial fibrillation.We started apixaban and cardioversion was attempted but was unsuccessful.Echocardiogram repeated February 2021 and showed normal LV function, mild right ventricular enlargement, mild biatrial enlargement, mild mitral regurgitation. Since last seen,patient notes dyspnea with activities causing him to rest.  He notes fatigue with this as well.  He states he can walk 50 to 100 feet before symptoms cause him to stop.  He denies chest pain, palpitations or syncope.  No pedal edema.  Current Outpatient Medications  Medication Sig Dispense Refill   atorvastatin (LIPITOR) 20 MG tablet Take 1 tablet (20 mg total) by mouth daily at 6 PM. 90 tablet 3   Calcium Carb-Cholecalciferol (CALCIUM 1000 + D PO) Take 1 tablet by mouth daily after breakfast.      ELIQUIS 5 MG TABS tablet TAKE 1 TABLET TWICE A DAY 180 tablet 3   fish oil-omega-3 fatty acids 1000 MG capsule Take 1,000 mg by mouth daily after breakfast.      furosemide (LASIX) 20 MG tablet Take 2 tablets (40 mg total) by mouth daily. 90 tablet 3   Ginkgo Biloba 120 MG CAPS Take 120 mg by mouth every evening.     Glucos-MSM-C-Mn-Ginger-Willow (GLUCOSAMINE MSM COMPLEX PO) Take 1 tablet by mouth daily.      hydroxypropyl methylcellulose / hypromellose (ISOPTO TEARS / GONIOVISC) 2.5 % ophthalmic solution Place 1 drop into both eyes 3 (three) times daily as needed for dry eyes.     losartan (COZAAR) 50 MG tablet Take 1 tablet (50 mg total) by mouth daily. 90 tablet 3   polyethylene glycol (MIRALAX / GLYCOLAX) 17 g packet Take 17 g by mouth as needed.      Tamsulosin HCl (FLOMAX) 0.4 MG CAPS Take 0.4 mg by mouth every evening.      Wheat Dextrin (BENEFIBER) POWD Take 2 Doses/Fill by mouth daily. Take 2 teaspoons by mouth daily      No current facility-administered medications for this visit.     Past Medical History:  Diagnosis Date   Carotid stenosis    10/2008: R 0-39% L 40-59%     07/2009: L ICA < 50%  L ECA high-grade  R mild plaque   Coronary artery disease    cath may 2008; stable   Diabetes mellitus type 2, diet-controlled ([facility])    Hearing loss    History of colonic polyps    History of prostate cancer    Hyperlipidemia    Hypertension    Osteoarthritis    PVD (peripheral vascular disease) ([facility])    Tubular adenoma of colon    history of     Past Surgical History:  Procedure Laterality Date   CARDIOVERSION N/A 01/03/2019   Procedure: CARDIOVERSION;  Surgeon: Sanda Klein, MD;  Location: [facility];  Service: Cardiovascular;  Laterality: N/A;   [place]  2008    Social History   Socioeconomic History   Marital status: Married    Spouse name: Not on file   Number of children: Not on file   Years of education: Not on file   Highest education level: Not on file  Occupational History   Not  on file  Tobacco Use   Smoking status: Former Smoker   Smokeless tobacco: Never Used  Substance and Sexual Activity   Alcohol use: No   Drug use: No   Sexual activity: Not on file  Other Topics Concern   Not on file  Social History Narrative   Not on file   Social Determinants of Health   Financial Resource Strain:    Difficulty of Paying Living Expenses: Not on file  Food Insecurity:    Worried About Charity fundraiser in the Last Year: Not on file   [employer] of Food in the Last Year: Not on file  Transportation Needs:    Lack of Transportation (Medical): Not on file   Lack of Transportation (Non-Medical): Not on file    Physical Activity:    Days of Exercise per Week: Not on file   Minutes of Exercise per Session: Not on file  Stress:    Feeling of Stress : Not on file  Social Connections:    Frequency of Communication with Friends and Family: Not on file   Frequency of Social Gatherings with Friends and Family: Not on file   Attends Religious Services: Not on file   Active Member of Clubs or Organizations: Not on file   Attends Archivist Meetings: Not on file   Marital Status: Not on file  Intimate Partner Violence:    Fear of Current or Ex-Partner: Not on file   Emotionally Abused: Not on file   Physically Abused: Not on file   Sexually Abused: Not on file    Family History  Problem Relation Age of Onset   Congestive Heart Failure Father    Heart disease Father    Breast cancer Sister    Stomach cancer Cousin    Diabetes Cousin    Diabetes Other    Heart disease Other    Prostate cancer Other    Heart disease Other    Colon cancer Neg Hx     ROS: no fevers or chills, productive cough, hemoptysis, dysphasia, odynophagia, melena, hematochezia, dysuria, hematuria, rash, seizure activity, orthopnea, PND, pedal edema, claudication. Remaining systems are negative.  Physical Exam: Well-developed well-nourished in no acute distress.  Skin is warm and dry.  HEENT is normal.  Neck is supple.  Chest is clear to auscultation with normal expansion.  Cardiovascular exam is irregular and bradycardic Abdominal exam nontender or distended. No masses palpated. Extremities show no edema. neuro grossly intact  Electrocardiogram today shows atrial fibrillation at a rate of 49, left axis deviation, cannot rule out septal infarct, nonspecific ST changes.  A/P  1 permanent atrial fibrillation-plan to continue apixaban at present dose.  His heart rate is controlled on no medications.  Check hemoglobin and renal function.  2 coronary artery disease-patient describes  dyspnea and fatigue with mean limited activity.  No chest pain.  I am concerned about potential anginal equivalent.  We will arrange a [place] nuclear study for risk stratification.  Continue statin.  No aspirin given need for anticoagulation.  3 hypertension-patient's blood pressure is controlled today.  Continue present medications and follow.  4 hyperlipidemia-continue statin.  Check lipids and liver.  5 chronic diastolic congestive heart failure-patient appears to be euvolemic on exam today.  Continue diuretics at present dose.  Continue fluid restriction and low-sodium diet.  6 mitral regurgitation-mild on most recent echocardiogram.  7 history of bradycardia-patient has not had syncope.  We will follow for now.  May require pacemaker  in the future.  8 carotid artery disease-mild on most recent Dopplers.  Kirk Ruths, MD

## 2020-04-04 ENCOUNTER — Encounter: Payer: Self-pay | Admitting: *Deleted

## 2020-04-04 ENCOUNTER — Ambulatory Visit (INDEPENDENT_AMBULATORY_CARE_PROVIDER_SITE_OTHER): Payer: Medicare Other | Admitting: Cardiology

## 2020-04-04 ENCOUNTER — Encounter: Payer: Self-pay | Admitting: Cardiology

## 2020-04-04 VITALS — BP 148/60 | HR 56 | Ht 70.0 in | Wt 159.0 lb

## 2020-04-04 DIAGNOSIS — E785 Hyperlipidemia, unspecified: Secondary | ICD-10-CM | POA: Diagnosis not present

## 2020-04-04 DIAGNOSIS — I4821 Permanent atrial fibrillation: Secondary | ICD-10-CM

## 2020-04-04 DIAGNOSIS — I10 Essential (primary) hypertension: Secondary | ICD-10-CM | POA: Diagnosis not present

## 2020-04-04 DIAGNOSIS — R079 Chest pain, unspecified: Secondary | ICD-10-CM

## 2020-04-04 DIAGNOSIS — I2510 Atherosclerotic heart disease of native coronary artery without angina pectoris: Secondary | ICD-10-CM | POA: Diagnosis not present

## 2020-04-04 NOTE — Patient Instructions (Signed)
  Lab Work:  Your physician recommends that you HAVE LAB WORK TODAY  If you have labs (blood work) drawn today and your tests are completely normal, you will receive your results only by: Marland Kitchen MyChart Message (if you have MyChart) OR . A paper copy in the mail If you have any lab test that is abnormal or we need to change your treatment, we will call you to review the results.   Testing/Procedures:  Your physician has requested that you have a lexiscan myoview. For further information please visit [email or web address]. Please follow instruction sheet, as given.[email or web address]     Follow-Up: At [facility], you and your health needs are our priority.  As part of our continuing mission to provide you with exceptional heart care, we have created designated Provider Care Teams.  These Care Teams include your primary Cardiologist (physician) and Advanced Practice Providers (APPs -  Physician Assistants and Nurse Practitioners) who all work together to provide you with the care you need, when you need it.  We recommend signing up for the patient portal called "MyChart".  Sign up information is provided on this After Visit Summary.  MyChart is used to connect with patients for Virtual Visits (Telemedicine).  Patients are able to view lab/test results, encounter notes, upcoming appointments, etc.  Non-urgent messages can be sent to your provider as well.   To learn more about what you can do with MyChart, go to [email or web address].    Your next appointment:   6 month(s)  The format for your next appointment:   In Person  Provider:   Kirk Ruths, MD

## 2020-04-05 ENCOUNTER — Encounter: Payer: Self-pay | Admitting: *Deleted

## 2020-04-05 LAB — LIPID PANEL
Chol/HDL Ratio: 1.8 ratio (ref 0.0–5.0)
Cholesterol, Total: 95 mg/dL — ABNORMAL LOW (ref 100–199)
HDL: 52 mg/dL (ref >39)
LDL Chol Calc (NIH): 30 mg/dL (ref 0–99)
Triglycerides: 54 mg/dL (ref 0–149)
VLDL Cholesterol Cal: 13 mg/dL (ref 5–40)

## 2020-04-05 LAB — COMPREHENSIVE METABOLIC PANEL
ALT: 25 IU/L (ref 0–44)
AST: 20 IU/L (ref 0–40)
Albumin/Globulin Ratio: 1.4 (ref 1.2–2.2)
Albumin: 4.1 g/dL (ref 3.6–4.6)
Alkaline Phosphatase: 106 IU/L (ref 44–121)
BUN/Creatinine Ratio: 23 (ref 10–24)
BUN: 30 mg/dL — ABNORMAL HIGH (ref 8–27)
Bilirubin Total: 0.6 mg/dL (ref 0.0–1.2)
CO2: 24 mmol/L (ref 20–29)
Calcium: 9.8 mg/dL (ref 8.6–10.2)
Chloride: 105 mmol/L (ref 96–106)
Creatinine, Ser: 1.33 mg/dL — ABNORMAL HIGH (ref 0.76–1.27)
GFR calc Af Amer: 54 mL/min/{1.73_m2} — ABNORMAL LOW (ref >59)
GFR calc non Af Amer: 47 mL/min/{1.73_m2} — ABNORMAL LOW (ref >59)
Globulin, Total: 3 g/dL (ref 1.5–4.5)
Glucose: 77 mg/dL (ref 65–99)
Potassium: 4.7 mmol/L (ref 3.5–5.2)
Sodium: 142 mmol/L (ref 134–144)
Total Protein: 7.1 g/dL (ref 6.0–8.5)

## 2020-04-05 LAB — CBC
Hematocrit: 36.6 % — ABNORMAL LOW (ref 37.5–51.0)
Hemoglobin: 12.1 g/dL — ABNORMAL LOW (ref 13.0–17.7)
MCH: 30.3 pg (ref 26.6–33.0)
MCHC: 33.1 g/dL (ref 31.5–35.7)
MCV: 92 fL (ref 79–97)
Platelets: 121 10*3/uL — ABNORMAL LOW (ref 150–450)
RBC: 3.99 x10E6/uL — ABNORMAL LOW (ref 4.14–5.80)
RDW: 12.6 % (ref 11.6–15.4)
WBC: 5.4 10*3/uL (ref 3.4–10.8)

## 2020-04-11 ENCOUNTER — Telehealth (HOSPITAL_COMMUNITY): Payer: Self-pay | Admitting: *Deleted

## 2020-04-11 NOTE — Telephone Encounter (Signed)
Left message on voicemail in reference to upcoming appointment scheduled for  04/18/20. Phone number given for a call back so details instructions can be given. Allen Anderson

## 2020-04-11 NOTE — Telephone Encounter (Signed)
Patient's wife returning [place] phone call. Advised that she would return their call when possible.

## 2020-04-12 ENCOUNTER — Telehealth (HOSPITAL_COMMUNITY): Payer: Self-pay | Admitting: *Deleted

## 2020-04-12 NOTE — Telephone Encounter (Signed)
Patient given detailed instructions per Myocardial Perfusion Study Information Sheet for the test on 04/18/20. Patient notified to arrive 15 minutes early and that it is imperative to arrive on time for appointment to keep from having the test rescheduled. ° If you need to cancel or reschedule your appointment, please call the office within 24 hours of your appointment. . Patient verbalized understanding. Allen Anderson Allen Anderson ° ° ° °

## 2020-04-18 ENCOUNTER — Other Ambulatory Visit: Payer: Self-pay

## 2020-04-18 ENCOUNTER — Ambulatory Visit (HOSPITAL_COMMUNITY): Payer: Medicare Other | Attending: Cardiology

## 2020-04-18 DIAGNOSIS — I2510 Atherosclerotic heart disease of native coronary artery without angina pectoris: Secondary | ICD-10-CM | POA: Insufficient documentation

## 2020-04-18 DIAGNOSIS — R079 Chest pain, unspecified: Secondary | ICD-10-CM | POA: Diagnosis present

## 2020-04-18 LAB — MYOCARDIAL PERFUSION IMAGING
LV dias vol: 79 mL (ref 62–150)
LV sys vol: 26 mL
Peak HR: 64 {beats}/min
Rest HR: 53 {beats}/min
SDS: 0
SRS: 0
SSS: 0
TID: 0.93

## 2020-04-18 MED ORDER — TECHNETIUM TC 99M TETROFOSMIN IV KIT
10.6000 | PACK | Freq: Once | INTRAVENOUS | Status: AC | PRN
  Administered 2020-04-18: 10.6 via INTRAVENOUS
  Filled 2020-04-18: qty 11

## 2020-04-18 MED ORDER — TECHNETIUM TC 99M TETROFOSMIN IV KIT
32.4000 | PACK | Freq: Once | INTRAVENOUS | Status: AC | PRN
  Administered 2020-04-18: 32.4 via INTRAVENOUS
  Filled 2020-04-18: qty 33

## 2020-04-18 MED ORDER — REGADENOSON 0.4 MG/5ML IV SOLN
0.4000 mg | Freq: Once | INTRAVENOUS | Status: AC
  Administered 2020-04-18: 0.4 mg via INTRAVENOUS

## 2020-04-23 ENCOUNTER — Encounter: Payer: Self-pay | Admitting: *Deleted

## 2020-06-29 ENCOUNTER — Other Ambulatory Visit: Payer: Self-pay | Admitting: Cardiology

## 2020-08-13 ENCOUNTER — Telehealth: Payer: Self-pay | Admitting: Cardiology

## 2020-08-13 DIAGNOSIS — I10 Essential (primary) hypertension: Secondary | ICD-10-CM

## 2020-08-13 MED ORDER — LOSARTAN POTASSIUM 50 MG PO TABS: 50.0000 mg | ORAL_TABLET | Freq: Every day | ORAL | 3 refills | Status: DC

## 2020-08-13 NOTE — Telephone Encounter (Signed)
*  STAT* If patient is at the pharmacy, call can be transferred to refill team.   1. Which medications need to be refilled? (please list name of each medication and dose if known)  New prescription for Losartan Potassium  2. Which pharmacy/location (including street and city if local pharmacy) is medication to be sent to? [facility]- [phone]  3. Do they need a 30 day or 90 day supply?90 days and refills

## 2020-08-21 ENCOUNTER — Telehealth: Payer: Self-pay | Admitting: Cardiology

## 2020-08-21 ENCOUNTER — Other Ambulatory Visit: Payer: Self-pay

## 2020-08-21 DIAGNOSIS — I10 Essential (primary) hypertension: Secondary | ICD-10-CM

## 2020-08-21 MED ORDER — LOSARTAN POTASSIUM 50 MG PO TABS: 50.0000 mg | ORAL_TABLET | Freq: Every day | ORAL | 3 refills | Status: DC

## 2020-08-21 NOTE — Telephone Encounter (Signed)
Pt c/o medication issue:  1. Name of Medication: Losartan with Potassium 50mg   2. How are you currently taking this medication (dosage and times per day)? n/a  3. Are you having a reaction (difficulty breathing--STAT)? no  4. What is your medication issue? Patient's daughter calling to get an update on this medication. He is completely out but the daughter was unsure if this is something that Dr. Stanford Breed wanted him to continue taking or to stop after he ran out. Please advise. See phone note on 08/13/20. Patient uses [facility] [phone]

## 2020-08-21 NOTE — Telephone Encounter (Signed)
Called patient daughter, advised per last note to continue medications. Sent in RX to pharmacy.   Daughter states her dad and mom Allen Anderson) both see Dr.Crenshaw- they normally try to get appointments on the same day around the same time as they do not drive and someone has to drive them. I advised I did not see an appointment for [facility], and they wanted to check with Dr.Crenshaws nurse on if this could happen the same day and would like a call back.   Will route to nurse to advise.  Thank you!

## 2020-08-22 NOTE — Telephone Encounter (Signed)
Spoke with pt daughter, aware jeanette is not due to be seen until august. Follow up scheduled for august.

## 2020-09-06 DIAGNOSIS — I10 Essential (primary) hypertension: Secondary | ICD-10-CM | POA: Diagnosis not present

## 2020-09-06 DIAGNOSIS — I517 Cardiomegaly: Secondary | ICD-10-CM | POA: Diagnosis not present

## 2020-09-06 DIAGNOSIS — I779 Disorder of arteries and arterioles, unspecified: Secondary | ICD-10-CM | POA: Diagnosis not present

## 2020-09-06 DIAGNOSIS — I4891 Unspecified atrial fibrillation: Secondary | ICD-10-CM | POA: Diagnosis not present

## 2020-09-06 DIAGNOSIS — I480 Paroxysmal atrial fibrillation: Secondary | ICD-10-CM | POA: Diagnosis not present

## 2020-09-26 NOTE — Progress Notes (Signed)
HPI: FU coronary disease, atrial fibrillationand CHF. He is status post bypass surgery in 2000. He also has mild-to-moderate carotid stenosis, hypertension, borderline DM2, hyperlipidemia and prostate cancer. He had a cardiac catheterization in May 2008 which showed stable revascularization. Carotid Dopplers in Oct 2015 showed no significant stenosis.Also with h/o atrial fibrillation.We started apixaban and cardioversion was attempted but was unsuccessful.Echocardiogram repeated February 2021 and showed normal LV function, mild right ventricular enlargement, mild biatrial enlargement, mild mitral regurgitation. Nuclear study November 2021 showed ejection fraction 67% with normal perfusion.  Since last seen,he has dyspnea with more extended periods of activity.  He denies orthopnea, PND, increased pedal edema, chest pain, palpitations, syncope or bleeding.  He is having difficulties with memory.  Current Outpatient Medications  Medication Sig Dispense Refill  . atorvastatin (LIPITOR) 20 MG tablet TAKE 1 TABLET DAILY AT 6 P.M. 90 tablet 3  . Calcium Carb-Cholecalciferol (CALCIUM 1000 + D PO) Take 1 tablet by mouth daily after breakfast.    . ELIQUIS 5 MG TABS tablet TAKE 1 TABLET TWICE A DAY 180 tablet 3  . fish oil-omega-3 fatty acids 1000 MG capsule Take 1,000 mg by mouth daily after breakfast.    . furosemide (LASIX) 20 MG tablet Take 2 tablets (40 mg total) by mouth daily. 90 tablet 3  . Ginkgo Biloba 120 MG CAPS Take 120 mg by mouth every evening.    Allen Anderson (GLUCOSAMINE MSM COMPLEX PO) Take 1 tablet by mouth daily.    . hydroxypropyl methylcellulose / hypromellose (ISOPTO TEARS / GONIOVISC) 2.5 % ophthalmic solution Place 1 drop into both eyes 3 (three) times daily as needed for dry eyes.    Marland Kitchen losartan (COZAAR) 50 MG tablet Take 1 tablet (50 mg total) by mouth daily. 90 tablet 3  . ofloxacin (OCUFLOX) 0.3 % ophthalmic solution Place 1 drop into both eyes  daily.    . polyethylene glycol (MIRALAX / GLYCOLAX) 17 g packet Take 17 g by mouth as needed.    . tamsulosin (FLOMAX) 0.4 MG CAPS capsule Take 0.4 mg by mouth every evening.    . Wheat Dextrin (BENEFIBER) POWD Take 2 Doses/Fill by mouth daily. Take 2 teaspoons by mouth daily     No current facility-administered medications for this visit.     Past Medical History:  Diagnosis Date  . Carotid stenosis    10/2008: R 0-39% L 40-59%     07/2009: L ICA < 50%  L ECA high-grade  R mild plaque  . Coronary artery disease    cath may 2008; stable  . Diabetes mellitus type 2, diet-controlled ([place])   . Hearing loss   . History of colonic polyps   . History of prostate cancer   . Hyperlipidemia   . Hypertension   . Osteoarthritis   . PVD (peripheral vascular disease) ([place])   . Tubular adenoma of colon    history of     Past Surgical History:  Procedure Laterality Date  . CARDIOVERSION N/A 01/03/2019   Procedure: CARDIOVERSION;  Surgeon: Sanda Klein, MD;  Location: [facility];  Service: Cardiovascular;  Laterality: N/A;  . CORONARY ARTERY BYPASS GRAFT  2000  . INSERTION PROSTATE RADIATION SEED  2008    Social History   Socioeconomic History  . Marital status: Married    Spouse name: Not on file  . Number of children: Not on file  . Years of education: Not on file  . Highest education level: Not on file  Occupational  History  . Not on file  Tobacco Use  . Smoking status: Former Research scientist (life sciences)  . Smokeless tobacco: Never Used  Substance and Sexual Activity  . Alcohol use: No  . Drug use: No  . Sexual activity: Not on file  Other Topics Concern  . Not on file  Social History Narrative  . Not on file   Social Determinants of Health   Financial Resource Strain: Not on file  Food Insecurity: Not on file  Transportation Needs: Not on file  Physical Activity: Not on file  Stress: Not on file  Social Connections: Not on file  Intimate Partner Violence: Not on file    Family  History  Problem Relation Age of Onset  . Congestive Heart Failure Father   . Heart disease Father   . Breast cancer Sister   . Stomach cancer Cousin   . Diabetes Cousin   . Diabetes Other   . Heart disease Other   . Prostate cancer Other   . Heart disease Other   . Colon cancer Neg Hx     ROS: Difficulties with memory but no fevers or chills, productive cough, hemoptysis, dysphasia, odynophagia, melena, hematochezia, dysuria, hematuria, rash, seizure activity, orthopnea, PND, pedal edema, claudication. Remaining systems are negative.  Physical Exam: Well-developed well-nourished in no acute distress.  Skin is warm and dry.  HEENT is normal.  Neck is supple.  Chest is clear to auscultation with normal expansion.  Cardiovascular exam is irregular Abdominal exam nontender or distended. No masses palpated. Extremities show trace edema. neuro grossly intact  A/P  1 coronary artery disease-most recent nuclear study showed no ischemia.  Continue medical therapy.  Continue statin.  He is not on aspirin given need for apixaban.  2 permanent atrial fibrillation-continue apixaban.  Note his most recent creatinine was 1.48.  We will recheck in 4 weeks.  Will need to decrease apixaban dose to 2.5 mg twice daily if creatinine increases further.  Heart rate controlled on no AV nodal blocking agents.  3 hypertension-blood pressure controlled.  Continue present medical regimen.  4 hyperlipidemia-continue statin.  5 chronic diastolic congestive heart failure-he remains euvolemic on exam.  Continue diuretic at present dose.  6 history of mitral regurgitation-mild on most recent echocardiogram.  7 history of bradycardia-avoid AV nodal blocking agents.  No history of syncope.  8 carotid artery disease-mild on most recent Dopplers.  9 decreased memory-he has asked to be referred to neurology for work-up for possible dementia and we will arrange.  Kirk Ruths, MD

## 2020-10-08 ENCOUNTER — Other Ambulatory Visit: Payer: Self-pay

## 2020-10-08 ENCOUNTER — Encounter: Payer: Self-pay | Admitting: Cardiology

## 2020-10-08 ENCOUNTER — Ambulatory Visit (INDEPENDENT_AMBULATORY_CARE_PROVIDER_SITE_OTHER): Payer: Medicare Other | Admitting: Cardiology

## 2020-10-08 VITALS — BP 126/64 | HR 51 | Ht 70.0 in | Wt 166.8 lb

## 2020-10-08 DIAGNOSIS — I10 Essential (primary) hypertension: Secondary | ICD-10-CM | POA: Diagnosis not present

## 2020-10-08 DIAGNOSIS — I4821 Permanent atrial fibrillation: Secondary | ICD-10-CM | POA: Diagnosis not present

## 2020-10-08 DIAGNOSIS — E785 Hyperlipidemia, unspecified: Secondary | ICD-10-CM | POA: Diagnosis not present

## 2020-10-08 DIAGNOSIS — I2510 Atherosclerotic heart disease of native coronary artery without angina pectoris: Secondary | ICD-10-CM

## 2020-10-08 DIAGNOSIS — R413 Other amnesia: Secondary | ICD-10-CM

## 2020-10-08 NOTE — Patient Instructions (Signed)
  Lab Work:  Your physician recommends that you return for lab work in: [place]  If you have labs (blood work) drawn today and your tests are completely normal, you will receive your results only by: Marland Kitchen MyChart Message (if you have MyChart) OR . A paper copy in the mail If you have any lab test that is abnormal or we need to change your treatment, we will call you to review the results.   Follow-Up: At [facility], you and your health needs are our priority.  As part of our continuing mission to provide you with exceptional heart care, we have created designated Provider Care Teams.  These Care Teams include your primary Cardiologist (physician) and Advanced Practice Providers (APPs -  Physician Assistants and Nurse Practitioners) who all work together to provide you with the care you need, when you need it.  We recommend signing up for the patient portal called "MyChart".  Sign up information is provided on this After Visit Summary.  MyChart is used to connect with patients for Virtual Visits (Telemedicine).  Patients are able to view lab/test results, encounter notes, upcoming appointments, etc.  Non-urgent messages can be sent to your provider as well.   To learn more about what you can do with MyChart, go to [email or web address].    Your next appointment:   6 month(s)  The format for your next appointment:   In Person  Provider:   Kirk Ruths, MD

## 2020-10-16 DIAGNOSIS — I4821 Permanent atrial fibrillation: Secondary | ICD-10-CM | POA: Diagnosis not present

## 2020-10-17 ENCOUNTER — Telehealth: Payer: Self-pay | Admitting: *Deleted

## 2020-10-17 DIAGNOSIS — N183 Chronic kidney disease, stage 3 unspecified: Secondary | ICD-10-CM

## 2020-10-17 DIAGNOSIS — I4819 Other persistent atrial fibrillation: Secondary | ICD-10-CM

## 2020-10-17 LAB — BASIC METABOLIC PANEL
BUN/Creatinine Ratio: 21 (ref 10–24)
BUN: 34 mg/dL — ABNORMAL HIGH (ref 8–27)
CO2: 22 mmol/L (ref 20–29)
Calcium: 9.7 mg/dL (ref 8.6–10.2)
Chloride: 103 mmol/L (ref 96–106)
Creatinine, Ser: 1.64 mg/dL — ABNORMAL HIGH (ref 0.76–1.27)
Glucose: 95 mg/dL (ref 65–99)
Potassium: 4.5 mmol/L (ref 3.5–5.2)
Sodium: 143 mmol/L (ref 134–144)
eGFR: 40 mL/min/{1.73_m2} — ABNORMAL LOW (ref >59)

## 2020-10-17 MED ORDER — FUROSEMIDE 20 MG PO TABS: 20.0000 mg | ORAL_TABLET | Freq: Every day | ORAL | 3 refills | Status: DC

## 2020-10-17 NOTE — Telephone Encounter (Addendum)
-----   Message from Lelon Perla, MD sent at 10/17/2020  7:54 AM EDT ----- Decrease Lasix to 20 mg daily.  Take an additional 20 mg daily for worsening lower extremity edema.  Check potassium and renal function in 1 week. [place] with pt wife, aware if medication change

## 2020-10-23 DIAGNOSIS — N183 Chronic kidney disease, stage 3 unspecified: Secondary | ICD-10-CM | POA: Diagnosis not present

## 2020-10-24 LAB — BASIC METABOLIC PANEL
BUN/Creatinine Ratio: 21 (ref 10–24)
BUN: 33 mg/dL — ABNORMAL HIGH (ref 8–27)
CO2: 23 mmol/L (ref 20–29)
Calcium: 9.8 mg/dL (ref 8.6–10.2)
Chloride: 106 mmol/L (ref 96–106)
Creatinine, Ser: 1.59 mg/dL — ABNORMAL HIGH (ref 0.76–1.27)
Glucose: 110 mg/dL — ABNORMAL HIGH (ref 65–99)
Potassium: 4.9 mmol/L (ref 3.5–5.2)
Sodium: 143 mmol/L (ref 134–144)
eGFR: 41 mL/min/{1.73_m2} — ABNORMAL LOW (ref >59)

## 2020-10-25 ENCOUNTER — Telehealth: Payer: Self-pay | Admitting: *Deleted

## 2020-10-25 MED ORDER — APIXABAN 2.5 MG PO TABS
2.5000 mg | ORAL_TABLET | Freq: Two times a day (BID) | ORAL | 3 refills | Status: DC
Start: 2020-10-25 — End: 2021-04-09

## 2020-10-25 NOTE — Telephone Encounter (Addendum)
-----   Message from Lelon Perla, MD sent at 10/24/2020  7:13 AM EDT ----- Change apixaban to 2.5 mg BID [place] with pt wife, aware of medication change. New script sent to the pharmacy

## 2020-11-27 ENCOUNTER — Telehealth: Payer: Self-pay | Admitting: Cardiology

## 2020-11-27 NOTE — Telephone Encounter (Signed)
Patient's daughter called to say that patient feels so week where he feels like he going to past out it. Patient can barley walk. Looking for appt explain there is nothing available to nov. Please advise

## 2020-11-27 NOTE — Telephone Encounter (Signed)
Spoke with pt daughter, the patient has been staying with them since his wife is in rehab. She reports the patient is having weak spells that if he does not sit down he will fall. He has been using a walker and even with the walker his legs will give out. Sunday he fell and she had to get EMS to get him up. He had a tear on his arm from the fall. The patient reports that he just runs out of gas or he has lost his air. They have no way of checking the patients blood pressure. She reports he has a lot of swelling in his feet and legs. Aware dr Stanford Breed is not in the office this week but they can see him on Monday 11th. Follow up scheduled

## 2020-11-28 NOTE — Progress Notes (Signed)
HPI: FU coronary disease, atrial fibrillation and CHF. He is status post bypass surgery in 2000. He also has mild-to-moderate carotid stenosis, hypertension, borderline DM2, hyperlipidemia and prostate cancer. He had a cardiac catheterization in May 2008 which showed stable revascularization. Carotid Dopplers in Oct 2015 showed no significant stenosis. Also with h/o atrial fibrillation. We started apixaban and cardioversion was attempted but was unsuccessful. Echocardiogram repeated February 2021 and showed normal LV function, mild right ventricular enlargement, mild biatrial enlargement, mild mitral regurgitation.  Nuclear study November 2021 showed ejection fraction 67% with normal perfusion.  Since last seen, the patient's daughter and son-in-law state he has been living with them while his wife is in rehab.  He has declined.  He apparently has spells when he is walking when he developed sudden weakness and near syncope.  These can last anywhere between 5 and 15 minutes.  There is some dyspnea but no associated palpitations, chest pain.  He has noticed worsening pedal edema.  He is requiring more care at home as well.  Current Outpatient Medications  Medication Sig Dispense Refill   apixaban (ELIQUIS) 2.5 MG TABS tablet Take 1 tablet (2.5 mg total) by mouth 2 (two) times daily. 180 tablet 3   atorvastatin (LIPITOR) 20 MG tablet TAKE 1 TABLET DAILY AT 6 P.M. 90 tablet 3   Calcium Carb-Cholecalciferol (CALCIUM 1000 + D PO) Take 1 tablet by mouth daily after breakfast.     fish oil-omega-3 fatty acids 1000 MG capsule Take 1,000 mg by mouth daily after breakfast.     furosemide (LASIX) 20 MG tablet Take 1 tablet (20 mg total) by mouth daily. 90 tablet 3   Ginkgo Biloba 120 MG CAPS Take 120 mg by mouth every evening.     Glucos-MSM-C-Mn-Ginger-Willow (GLUCOSAMINE MSM COMPLEX PO) Take 1 tablet by mouth daily.     hydroxypropyl methylcellulose / hypromellose (ISOPTO TEARS / GONIOVISC) 2.5 %  ophthalmic solution Place 1 drop into both eyes 3 (three) times daily as needed for dry eyes.     losartan (COZAAR) 50 MG tablet Take 1 tablet (50 mg total) by mouth daily. 90 tablet 3   ofloxacin (OCUFLOX) 0.3 % ophthalmic solution Place 1 drop into both eyes daily.     tamsulosin (FLOMAX) 0.4 MG CAPS capsule Take 0.4 mg by mouth every evening.     No current facility-administered medications for this visit.     Past Medical History:  Diagnosis Date   Carotid stenosis    10/2008: R 0-39% L 40-59%     07/2009: L ICA < 50%  L ECA high-grade  R mild plaque   Coronary artery disease    cath may 2008; stable   Diabetes mellitus type 2, diet-controlled ([facility])    Hearing loss    History of colonic polyps    History of prostate cancer    Hyperlipidemia    Hypertension    Osteoarthritis    PVD (peripheral vascular disease) ([facility])    Tubular adenoma of colon    history of     Past Surgical History:  Procedure Laterality Date   CARDIOVERSION N/A 01/03/2019   Procedure: CARDIOVERSION;  Surgeon: Sanda Klein, MD;  Location: [facility];  Service: Cardiovascular;  Laterality: N/A;   [place]  2008    Social History   Socioeconomic History   Marital status: Married    Spouse name: Not on file   Number of children:  Not on file   Years of education: Not on file   Highest education level: Not on file  Occupational History   Not on file  Tobacco Use   Smoking status: Former    Pack years: 0.00   Smokeless tobacco: Never  Substance and Sexual Activity   Alcohol use: No   Drug use: No   Sexual activity: Not on file  Other Topics Concern   Not on file  Social History Narrative   Not on file   Social Determinants of Health   Financial Resource Strain: Not on file  Food Insecurity: Not on file  Transportation Needs: Not on file  Physical Activity: Not on file  Stress: Not on file  Social Connections: Not on file   Intimate Partner Violence: Not on file    Family History  Problem Relation Age of Onset   Congestive Heart Failure Father    Heart disease Father    Breast cancer Sister    Stomach cancer Cousin    Diabetes Cousin    Diabetes Other    Heart disease Other    Prostate cancer Other    Heart disease Other    Colon cancer Neg Hx     ROS: no fevers or chills, productive cough, hemoptysis, dysphasia, odynophagia, melena, hematochezia, dysuria, hematuria, rash, seizure activity, orthopnea, PND, claudication. Remaining systems are negative.  Physical Exam: Well-developed well-nourished elderly in no acute distress.  Skin is warm and dry.  HEENT is normal.  Neck is supple.  Chest is clear to auscultation with normal expansion.  Cardiovascular exam is irregular Abdominal exam nontender or distended. No masses palpated. Extremities show 1+ edema. neuro grossly intact  ECG-atrial fibrillation at a rate of 56, left anterior fascicular block, anterior infarct, nonspecific ST changes.  Personally reviewed  A/P  1 coronary artery disease-patient denies chest pain.  Continue statin.  No aspirin given need for apixaban.  2 permanent atrial fibrillation-patient remains in atrial fibrillation.  He is having spells of weakness/presyncope.  Question etiology.  Not convinced that this sounds bradycardia mediated but would like to exclude.  We will arrange an event monitor to further assess.  Repeat echocardiogram.  Avoid AV nodal blocking agents.  We will continue apixaban for now.  However if he has difficulties falling then risk may outweigh benefit and we may need to discontinue.  Note he is scheduled to see neurology to see if there is a neurological component to his "spells".  3 hypertension-patient's blood pressure is controlled.  However he is describing spells of weakness with ambulation.  Question contribution from lower blood pressure.  Discontinue losartan to see if his symptoms improve.   Follow blood pressure and adjust medications as needed.  4 hyperlipidemia-continue statin.  5 chronic diastolic congestive heart failure-patient has increased pedal edema.  Repeat echocardiogram.  Increase Lasix to 40 mg daily.  Check potassium and renal function.  6 mitral regurgitation-mild on most recent echocardiogram.  7 history of bradycardia-continue to avoid AV nodal blocking agents.  8 carotid artery disease-mild on most recent Dopplers.  Kirk Ruths, MD

## 2020-12-03 ENCOUNTER — Encounter: Payer: Self-pay | Admitting: *Deleted

## 2020-12-03 ENCOUNTER — Other Ambulatory Visit: Payer: Self-pay

## 2020-12-03 ENCOUNTER — Ambulatory Visit (INDEPENDENT_AMBULATORY_CARE_PROVIDER_SITE_OTHER): Payer: Medicare Other | Admitting: Cardiology

## 2020-12-03 ENCOUNTER — Encounter: Payer: Self-pay | Admitting: Cardiology

## 2020-12-03 VITALS — BP 102/56 | HR 56 | Ht 70.0 in | Wt 181.0 lb

## 2020-12-03 DIAGNOSIS — I2510 Atherosclerotic heart disease of native coronary artery without angina pectoris: Secondary | ICD-10-CM

## 2020-12-03 DIAGNOSIS — I4819 Other persistent atrial fibrillation: Secondary | ICD-10-CM

## 2020-12-03 DIAGNOSIS — I10 Essential (primary) hypertension: Secondary | ICD-10-CM

## 2020-12-03 DIAGNOSIS — E785 Hyperlipidemia, unspecified: Secondary | ICD-10-CM

## 2020-12-03 DIAGNOSIS — R55 Syncope and collapse: Secondary | ICD-10-CM | POA: Diagnosis not present

## 2020-12-03 MED ORDER — FUROSEMIDE 20 MG PO TABS: 40.0000 mg | ORAL_TABLET | Freq: Every day | ORAL | 3 refills | Status: AC

## 2020-12-03 NOTE — Progress Notes (Unsigned)
Patient ID: Allen Anderson, male   DOB: 1929-03-30, 87 y.o.   MRN: [id number] Patient enrolled for [employer] to ship a 30 day cardiac event monitor to [address]  [place], [place]  [address]. Letter with instructions mailed to same address.

## 2020-12-03 NOTE — Patient Instructions (Signed)
Medication Instructions:   STOP LOSARTAN  INCREASE FUROSEMIDE TO 40 MG ONCE DAILY= 2 OF THE 20 MG TABLETS ONCE DAILY  *If you need a refill on your cardiac medications before your next appointment, please call your pharmacy*   Testing/Procedures:  Your physician has requested that you have an echocardiogram. Echocardiography is a painless test that uses sound waves to create images of your heart. It provides your doctor with information about the size and shape of your heart and how well your heart's chambers and valves are working. This procedure takes approximately one hour. There are no restrictions for this procedure. [place] has recommended that you wear an event monitor. Event monitors are medical devices that record the heart's electrical activity. Doctors most often [place] these monitors to diagnose arrhythmias. Arrhythmias are problems with the speed or rhythm of the heartbeat. The monitor is a small, portable device. You can wear one while you do your normal daily activities. This is usually used to diagnose what is causing palpitations/syncope (passing out).    Follow-Up: At [facility], you and your health needs are our priority.  As part of our continuing mission to provide you with exceptional heart care, we have created designated Provider Care Teams.  These Care Teams include your primary Cardiologist (physician) and Advanced Practice Providers (APPs -  Physician Assistants and Nurse Practitioners) who all work together to provide you with the care you need, when you need it.  We recommend signing up for the patient portal called "MyChart".  Sign up information is provided on this After Visit Summary.  MyChart is used to connect with patients for Virtual Visits (Telemedicine).  Patients are able to view lab/test results, encounter notes, upcoming appointments, etc.  Non-urgent messages can be sent to your provider as well.   To learn more about what  you can do with MyChart, go to [email or web address].    Your next appointment:   3 month(s)  The format for your next appointment:   In Person  Provider:   Kirk Ruths, MD

## 2020-12-04 LAB — BASIC METABOLIC PANEL
BUN/Creatinine Ratio: 20 (ref 10–24)
BUN: 31 mg/dL — ABNORMAL HIGH (ref 8–27)
CO2: 20 mmol/L (ref 20–29)
Calcium: 9.4 mg/dL (ref 8.6–10.2)
Chloride: 100 mmol/L (ref 96–106)
Creatinine, Ser: 1.56 mg/dL — ABNORMAL HIGH (ref 0.76–1.27)
Glucose: 137 mg/dL — ABNORMAL HIGH (ref 65–99)
Potassium: 4.6 mmol/L (ref 3.5–5.2)
Sodium: 140 mmol/L (ref 134–144)
eGFR: 42 mL/min/{1.73_m2} — ABNORMAL LOW (ref >59)

## 2020-12-05 ENCOUNTER — Encounter: Payer: Self-pay | Admitting: *Deleted

## 2020-12-09 ENCOUNTER — Ambulatory Visit (INDEPENDENT_AMBULATORY_CARE_PROVIDER_SITE_OTHER): Payer: Medicare Other

## 2020-12-09 DIAGNOSIS — R55 Syncope and collapse: Secondary | ICD-10-CM | POA: Diagnosis not present

## 2020-12-19 DIAGNOSIS — H52223 Regular astigmatism, bilateral: Secondary | ICD-10-CM | POA: Diagnosis not present

## 2020-12-19 DIAGNOSIS — H04123 Dry eye syndrome of bilateral lacrimal glands: Secondary | ICD-10-CM | POA: Diagnosis not present

## 2020-12-19 DIAGNOSIS — E113291 Type 2 diabetes mellitus with mild nonproliferative diabetic retinopathy without macular edema, right eye: Secondary | ICD-10-CM | POA: Diagnosis not present

## 2020-12-19 DIAGNOSIS — H5213 Myopia, bilateral: Secondary | ICD-10-CM | POA: Diagnosis not present

## 2020-12-19 DIAGNOSIS — H01009 Unspecified blepharitis unspecified eye, unspecified eyelid: Secondary | ICD-10-CM | POA: Diagnosis not present

## 2020-12-19 DIAGNOSIS — H532 Diplopia: Secondary | ICD-10-CM | POA: Diagnosis not present

## 2020-12-26 ENCOUNTER — Ambulatory Visit (HOSPITAL_COMMUNITY): Payer: Medicare Other | Attending: Cardiovascular Disease

## 2020-12-26 ENCOUNTER — Telehealth: Payer: Self-pay | Admitting: Physician Assistant

## 2020-12-26 ENCOUNTER — Other Ambulatory Visit: Payer: Self-pay

## 2020-12-26 DIAGNOSIS — R55 Syncope and collapse: Secondary | ICD-10-CM | POA: Insufficient documentation

## 2020-12-26 LAB — ECHOCARDIOGRAM COMPLETE
Area-P 1/2: 1.42 cm2
MV M vel: 5.95 m/s
MV Peak grad: 141.4 mmHg
MV VTI: 1.45 cm2
Radius: 0.6 cm
S' Lateral: 3.4 cm

## 2020-12-26 NOTE — Telephone Encounter (Signed)
Paged from [employer].  Received manually transmission at 3:55 central time.  AFib at rate of 60 bpm.  No pause or significant bradycardia.   Patient with hx of permanent afib.   Called x 2 to review symptoms but did not picked up phone. Left voice mail that office will call in morning.

## 2020-12-27 ENCOUNTER — Encounter: Payer: Self-pay | Admitting: *Deleted

## 2020-12-27 NOTE — Telephone Encounter (Signed)
Left message to call back  

## 2021-01-01 ENCOUNTER — Ambulatory Visit (INDEPENDENT_AMBULATORY_CARE_PROVIDER_SITE_OTHER): Payer: Medicare Other | Admitting: Diagnostic Neuroimaging

## 2021-01-01 ENCOUNTER — Encounter: Payer: Self-pay | Admitting: Diagnostic Neuroimaging

## 2021-01-01 VITALS — BP 155/65 | HR 54 | Ht 70.0 in | Wt 181.0 lb

## 2021-01-01 DIAGNOSIS — F03B0 Unspecified dementia, moderate, without behavioral disturbance, psychotic disturbance, mood disturbance, and anxiety: Secondary | ICD-10-CM

## 2021-01-01 DIAGNOSIS — I2510 Atherosclerotic heart disease of native coronary artery without angina pectoris: Secondary | ICD-10-CM | POA: Diagnosis not present

## 2021-01-01 DIAGNOSIS — F0390 Unspecified dementia without behavioral disturbance: Secondary | ICD-10-CM

## 2021-01-01 NOTE — Patient Instructions (Signed)
MEMORY LOSS (moderate dementia without behavior changes) - safety / supervision issues reviewed - daily physical activity / exercise (at least 15-30 minutes) - eat more plants / vegetables - increase social activities, brain stimulation, games, puzzles, hobbies, crafts, arts, music - aim for at least 7-8 hours sleep per night (or more) - avoid smoking and alcohol - caregiver resources provided - caution with medications, finances; no driving - recommend to establish with neurology closer to their home in [place]; consider CT head as well in [place]

## 2021-01-01 NOTE — Progress Notes (Signed)
[facility]  PATIENT: Allen Anderson DOB: [birth year]  REFERRING CLINICIAN: Leonard Downing, * HISTORY FROM: patient  REASON FOR VISIT: new consult    HISTORICAL  CHIEF COMPLAINT:  Chief Complaint  Patient presents with   Memory difficulties    Rm 6 New Pt, dgtrMariann Anderson MMSE 12    HISTORY OF PRESENT ILLNESS:   87 year old male here for evaluation of memory loss.  Patient has had gradual onset progressive short-term memory loss, confusion, confusing family members, events and having decline in ADLs since 2019.  January 2021 patient moved in with daughter.  His wife has been in the hospital 5 times in the last month.   REVIEW OF SYSTEMS: Full 14 system review of systems performed and negative with exception of: as per HPI.   ALLERGIES: Allergies  Allergen Reactions   Beta Adrenergic Blockers Other (See Comments)    bradycardia    HOME MEDICATIONS: Outpatient Medications Prior to Visit  Medication Sig Dispense Refill   apixaban (ELIQUIS) 2.5 MG TABS tablet Take 1 tablet (2.5 mg total) by mouth 2 (two) times daily. 180 tablet 3   atorvastatin (LIPITOR) 20 MG tablet TAKE 1 TABLET DAILY AT 6 P.M. 90 tablet 3   Cholecalciferol (D3-50 PO) Take 50 mcg by mouth at bedtime.     furosemide (LASIX) 20 MG tablet Take 2 tablets (40 mg total) by mouth daily. 90 tablet 3   Ginkgo Biloba 120 MG CAPS Take 120 mg by mouth every evening.     Glucos-MSM-C-Mn-Ginger-Willow (GLUCOSAMINE MSM COMPLEX PO) Take 1 tablet by mouth daily.     hydroxypropyl methylcellulose / hypromellose (ISOPTO TEARS / GONIOVISC) 2.5 % ophthalmic solution Place 1 drop into both eyes 3 (three) times daily as needed for dry eyes.     LOSARTAN POTASSIUM PO Take 50 mg by mouth. 01/01/21 on hold right now     Multiple Vitamin (MULTIVITAMIN) tablet Take 1 tablet by mouth daily. senior     ofloxacin (OCUFLOX) 0.3 % ophthalmic solution Place 1 drop into both eyes daily.     tamsulosin (FLOMAX) 0.4 MG  CAPS capsule Take 0.4 mg by mouth every evening.     vitamin B-12 (CYANOCOBALAMIN) 500 MCG tablet Take 500 mcg by mouth daily.     zinc gluconate 50 MG tablet Take 50 mg by mouth daily.     Calcium Carb-Cholecalciferol (CALCIUM 1000 + D PO) Take 1 tablet by mouth daily after breakfast.     fish oil-omega-3 fatty acids 1000 MG capsule Take 1,000 mg by mouth daily after breakfast.     No facility-administered medications prior to visit.    PAST MEDICAL HISTORY: Past Medical History:  Diagnosis Date   Carotid stenosis    10/2008: R 0-39% L 40-59%     07/2009: L ICA < 50%  L ECA high-grade  R mild plaque   Coronary artery disease    cath may 2008; stable   Diabetes mellitus type 2, diet-controlled (HCC)    Hearing loss    History of colonic polyps    History of prostate cancer    Hyperlipidemia    Hypertension    Memory change    Osteoarthritis    PVD (peripheral vascular disease) ([place])    Tubular adenoma of colon    history of     PAST SURGICAL HISTORY: Past Surgical History:  Procedure Laterality Date   CARDIOVERSION N/A 01/03/2019   Procedure: CARDIOVERSION;  Surgeon: Sanda Klein, MD;  Location: [facility];  Service: Cardiovascular;  Laterality: N/A;   CORONARY ARTERY BYPASS GRAFT  2000   INSERTION PROSTATE RADIATION SEED  2008    FAMILY HISTORY: Family History  Problem Relation Age of Onset   Congestive Heart Failure Father    Heart disease Father    Breast cancer Sister    Stomach cancer Cousin    Diabetes Cousin    Diabetes Other    Heart disease Other    Prostate cancer Other    Heart disease Other    Colon cancer Neg Hx     SOCIAL HISTORY: Social History   Socioeconomic History   Marital status: Married    Spouse name: Not on file   Number of children: 1   Years of education: Not on file   Highest education level: 11th grade  Occupational History   Not on file  Tobacco Use   Smoking status: Former   Smokeless tobacco: Never  Substance and  Sexual Activity   Alcohol use: No   Drug use: No   Sexual activity: Not on file  Other Topics Concern   Not on file  Social History Narrative   01/01/21 living with dgtr Allen Anderson   Social Determinants of Health   Financial Resource Strain: Not on file  Food Insecurity: Not on file  Transportation Needs: Not on file  Physical Activity: Not on file  Stress: Not on file  Social Connections: Not on file  Intimate Partner Violence: Not on file     PHYSICAL EXAM  GENERAL EXAM/CONSTITUTIONAL: Vitals:  Vitals:   01/01/21 1415  BP: (!) 155/65  Pulse: (!) 54  Weight: 181 lb (82.1 kg)  Height: '5\' 10"'$  (1.778 m)   Body mass index is 25.97 kg/m. Wt Readings from Last 3 Encounters:  01/01/21 181 lb (82.1 kg)  12/03/20 181 lb (82.1 kg)  10/08/20 166 lb 12.8 oz (75.7 kg)   Patient is in no distress; well developed, nourished and groomed; neck is supple  CARDIOVASCULAR: Examination of carotid arteries is normal; no carotid bruits Regular rate and rhythm, no murmurs Examination of peripheral vascular system by observation and palpation is normal  EYES: Ophthalmoscopic exam of optic discs and posterior segments is normal; no papilledema or hemorrhages No results found.  MUSCULOSKELETAL: Gait, strength, tone, movements noted in Neurologic exam below  NEUROLOGIC: MENTAL STATUS:  MMSE - [place] Exam 01/01/2021  Orientation to time 1  Orientation to Place 1  Registration 1  Attention/ Calculation 0  Recall 2  Language- name 2 objects 2  Language- repeat 0  Language- follow 3 step command 3  Language- read & follow direction 1  Write a sentence 1  Copy design 0  Total score 12   awake, alert, oriented to person DECR memory intact DECR attention and concentration language fluent, comprehension intact, naming intact fund of knowledge appropriate  CRANIAL NERVE:  2nd - no papilledema on fundoscopic exam 2nd, 3rd, 4th, 6th - pupils equal and reactive to light,  visual fields full to confrontation, extraocular muscles intact, no nystagmus 5th - facial sensation symmetric 7th - facial strength symmetric 8th - hearing intact 9th - palate elevates symmetrically, uvula midline 11th - shoulder shrug symmetric 12th - tongue protrusion midline  MOTOR:  MILD MOTOR APRAXIA; DIFFUSE 4/5  SENSORY:  normal and symmetric to light touch, temperature, vibration; DECR IN FEET  COORDINATION:  finger-nose-finger, fine finger movements normal  REFLEXES:  deep tendon reflexes TRACE and symmetric  GAIT/STATION:  IN WHEEL CHAIR  DIAGNOSTIC DATA (LABS, IMAGING, TESTING) - I reviewed patient records, labs, notes, testing and imaging myself where available.  Lab Results  Component Value Date   WBC 5.4 04/04/2020   HGB 12.1 (L) 04/04/2020   HCT 36.6 (L) 04/04/2020   MCV 92 04/04/2020   PLT 121 (L) 04/04/2020      Component Value Date/Time   NA 140 12/03/2020 1546   K 4.6 12/03/2020 1546   CL 100 12/03/2020 1546   CO2 20 12/03/2020 1546   GLUCOSE 137 (H) 12/03/2020 1546   GLUCOSE 109 (H) 10/05/2007 0825   BUN 31 (H) 12/03/2020 1546   CREATININE 1.56 (H) 12/03/2020 1546   CALCIUM 9.4 12/03/2020 1546   PROT 7.1 04/04/2020 1203   ALBUMIN 4.1 04/04/2020 1203   AST 20 04/04/2020 1203   ALT 25 04/04/2020 1203   ALKPHOS 106 04/04/2020 1203   BILITOT 0.6 04/04/2020 1203   GFRNONAA 47 (L) 04/04/2020 1203   GFRAA 54 (L) 04/04/2020 1203   Lab Results  Component Value Date   CHOL 95 (L) 04/04/2020   HDL 52 04/04/2020   LDLCALC 30 04/04/2020   TRIG 54 04/04/2020   CHOLHDL 1.8 04/04/2020   Lab Results  Component Value Date   HGBA1C 6.6 (H) 10/05/2007   No results found for: [id number] Lab Results  Component Value Date   TSH 2.110 11/24/2018      ASSESSMENT AND PLAN  87 y.o. year old male here with:  Dx:  1. Moderate dementia without behavioral disturbance (HCC)     PLAN:  MEMORY LOSS (moderate dementia without behavior  changes) - safety / supervision issues reviewed - daily physical activity / exercise (at least 15-30 minutes) - eat more plants / vegetables - increase social activities, brain stimulation, games, puzzles, hobbies, crafts, arts, music - aim for at least 7-8 hours sleep per night (or more) - avoid smoking and alcohol - caregiver resources provided - caution with medications, finances; no driving - recommend to establish with neurology closer to their home in [place]; consider CT head as well in [place]  Return for return to PCP, pending if symptoms worsen or fail to improve.    Penni Bombard, MD [id number], [id number] PM Certified in Neurology, Neurophysiology and Neuroimaging  [facility] [address] [place], [place] [address] [phone]

## 2021-01-01 NOTE — Telephone Encounter (Signed)
Lm to call back ./cy 

## 2021-01-02 ENCOUNTER — Encounter: Payer: Self-pay | Admitting: Diagnostic Neuroimaging

## 2021-01-04 DIAGNOSIS — C61 Malignant neoplasm of prostate: Secondary | ICD-10-CM | POA: Diagnosis not present

## 2021-01-07 NOTE — Telephone Encounter (Signed)
Spoke to patient's son n law at # [phone]. He stated patient is living with them.He stated he tried to return call and was unable to get anyone to answer phone.Stated he did pass out on 8/3 after a long day.Stated he had a very tiring day and had been up a long time.Stated he is doing ok now.He has not had anymore syncopal episodes.Tomorrow 8/16 is his last day to wear monitor.

## 2021-01-10 ENCOUNTER — Encounter: Payer: Self-pay | Admitting: *Deleted

## 2021-01-11 DIAGNOSIS — N401 Enlarged prostate with lower urinary tract symptoms: Secondary | ICD-10-CM | POA: Diagnosis not present

## 2021-01-11 DIAGNOSIS — R3914 Feeling of incomplete bladder emptying: Secondary | ICD-10-CM | POA: Diagnosis not present

## 2021-01-11 DIAGNOSIS — C61 Malignant neoplasm of prostate: Secondary | ICD-10-CM | POA: Diagnosis not present

## 2021-01-11 DIAGNOSIS — H532 Diplopia: Secondary | ICD-10-CM | POA: Diagnosis not present

## 2021-01-25 DIAGNOSIS — N401 Enlarged prostate with lower urinary tract symptoms: Secondary | ICD-10-CM | POA: Diagnosis not present

## 2021-01-25 DIAGNOSIS — R3914 Feeling of incomplete bladder emptying: Secondary | ICD-10-CM | POA: Diagnosis not present

## 2021-02-03 DIAGNOSIS — R5381 Other malaise: Secondary | ICD-10-CM | POA: Diagnosis not present

## 2021-02-03 DIAGNOSIS — I340 Nonrheumatic mitral (valve) insufficiency: Secondary | ICD-10-CM | POA: Diagnosis not present

## 2021-02-03 DIAGNOSIS — G309 Alzheimer's disease, unspecified: Secondary | ICD-10-CM | POA: Diagnosis not present

## 2021-02-03 DIAGNOSIS — I5032 Chronic diastolic (congestive) heart failure: Secondary | ICD-10-CM | POA: Diagnosis not present

## 2021-02-03 DIAGNOSIS — I6522 Occlusion and stenosis of left carotid artery: Secondary | ICD-10-CM | POA: Diagnosis not present

## 2021-02-03 DIAGNOSIS — I4891 Unspecified atrial fibrillation: Secondary | ICD-10-CM | POA: Diagnosis not present

## 2021-02-03 DIAGNOSIS — R2681 Unsteadiness on feet: Secondary | ICD-10-CM | POA: Diagnosis not present

## 2021-02-10 DIAGNOSIS — G4700 Insomnia, unspecified: Secondary | ICD-10-CM | POA: Diagnosis not present

## 2021-02-10 DIAGNOSIS — I10 Essential (primary) hypertension: Secondary | ICD-10-CM | POA: Diagnosis not present

## 2021-02-13 DIAGNOSIS — E119 Type 2 diabetes mellitus without complications: Secondary | ICD-10-CM | POA: Diagnosis not present

## 2021-02-13 DIAGNOSIS — M1990 Unspecified osteoarthritis, unspecified site: Secondary | ICD-10-CM | POA: Diagnosis not present

## 2021-02-13 DIAGNOSIS — G309 Alzheimer's disease, unspecified: Secondary | ICD-10-CM | POA: Diagnosis not present

## 2021-02-13 DIAGNOSIS — Z7901 Long term (current) use of anticoagulants: Secondary | ICD-10-CM | POA: Diagnosis not present

## 2021-02-13 DIAGNOSIS — Z8546 Personal history of malignant neoplasm of prostate: Secondary | ICD-10-CM | POA: Diagnosis not present

## 2021-02-13 DIAGNOSIS — M6281 Muscle weakness (generalized): Secondary | ICD-10-CM | POA: Diagnosis not present

## 2021-02-13 DIAGNOSIS — N1832 Chronic kidney disease, stage 3b: Secondary | ICD-10-CM | POA: Diagnosis not present

## 2021-02-13 DIAGNOSIS — R279 Unspecified lack of coordination: Secondary | ICD-10-CM | POA: Diagnosis not present

## 2021-02-13 DIAGNOSIS — R011 Cardiac murmur, unspecified: Secondary | ICD-10-CM | POA: Diagnosis not present

## 2021-02-13 DIAGNOSIS — I5032 Chronic diastolic (congestive) heart failure: Secondary | ICD-10-CM | POA: Diagnosis not present

## 2021-02-13 DIAGNOSIS — I10 Essential (primary) hypertension: Secondary | ICD-10-CM | POA: Diagnosis not present

## 2021-02-13 DIAGNOSIS — E559 Vitamin D deficiency, unspecified: Secondary | ICD-10-CM | POA: Diagnosis not present

## 2021-02-13 DIAGNOSIS — I6523 Occlusion and stenosis of bilateral carotid arteries: Secondary | ICD-10-CM | POA: Diagnosis not present

## 2021-02-13 DIAGNOSIS — I482 Chronic atrial fibrillation, unspecified: Secondary | ICD-10-CM | POA: Diagnosis not present

## 2021-02-13 DIAGNOSIS — R2681 Unsteadiness on feet: Secondary | ICD-10-CM | POA: Diagnosis not present

## 2021-02-13 DIAGNOSIS — Z9181 History of falling: Secondary | ICD-10-CM | POA: Diagnosis not present

## 2021-02-13 DIAGNOSIS — Z79899 Other long term (current) drug therapy: Secondary | ICD-10-CM | POA: Diagnosis not present

## 2021-02-14 DIAGNOSIS — R2681 Unsteadiness on feet: Secondary | ICD-10-CM | POA: Diagnosis not present

## 2021-02-14 DIAGNOSIS — G309 Alzheimer's disease, unspecified: Secondary | ICD-10-CM | POA: Diagnosis not present

## 2021-02-14 DIAGNOSIS — Z9181 History of falling: Secondary | ICD-10-CM | POA: Diagnosis not present

## 2021-02-14 DIAGNOSIS — M6281 Muscle weakness (generalized): Secondary | ICD-10-CM | POA: Diagnosis not present

## 2021-02-14 DIAGNOSIS — I482 Chronic atrial fibrillation, unspecified: Secondary | ICD-10-CM | POA: Diagnosis not present

## 2021-02-14 DIAGNOSIS — R279 Unspecified lack of coordination: Secondary | ICD-10-CM | POA: Diagnosis not present

## 2021-02-18 DIAGNOSIS — R2681 Unsteadiness on feet: Secondary | ICD-10-CM | POA: Diagnosis not present

## 2021-02-18 DIAGNOSIS — R279 Unspecified lack of coordination: Secondary | ICD-10-CM | POA: Diagnosis not present

## 2021-02-18 DIAGNOSIS — I482 Chronic atrial fibrillation, unspecified: Secondary | ICD-10-CM | POA: Diagnosis not present

## 2021-02-18 DIAGNOSIS — M6281 Muscle weakness (generalized): Secondary | ICD-10-CM | POA: Diagnosis not present

## 2021-02-18 DIAGNOSIS — G309 Alzheimer's disease, unspecified: Secondary | ICD-10-CM | POA: Diagnosis not present

## 2021-02-18 DIAGNOSIS — Z9181 History of falling: Secondary | ICD-10-CM | POA: Diagnosis not present

## 2021-02-19 DIAGNOSIS — Z9181 History of falling: Secondary | ICD-10-CM | POA: Diagnosis not present

## 2021-02-19 DIAGNOSIS — G309 Alzheimer's disease, unspecified: Secondary | ICD-10-CM | POA: Diagnosis not present

## 2021-02-19 DIAGNOSIS — I482 Chronic atrial fibrillation, unspecified: Secondary | ICD-10-CM | POA: Diagnosis not present

## 2021-02-19 DIAGNOSIS — M6281 Muscle weakness (generalized): Secondary | ICD-10-CM | POA: Diagnosis not present

## 2021-02-19 DIAGNOSIS — R279 Unspecified lack of coordination: Secondary | ICD-10-CM | POA: Diagnosis not present

## 2021-02-19 DIAGNOSIS — R2681 Unsteadiness on feet: Secondary | ICD-10-CM | POA: Diagnosis not present

## 2021-02-20 DIAGNOSIS — Z9181 History of falling: Secondary | ICD-10-CM | POA: Diagnosis not present

## 2021-02-20 DIAGNOSIS — R2681 Unsteadiness on feet: Secondary | ICD-10-CM | POA: Diagnosis not present

## 2021-02-20 DIAGNOSIS — G309 Alzheimer's disease, unspecified: Secondary | ICD-10-CM | POA: Diagnosis not present

## 2021-02-20 DIAGNOSIS — R279 Unspecified lack of coordination: Secondary | ICD-10-CM | POA: Diagnosis not present

## 2021-02-20 DIAGNOSIS — M6281 Muscle weakness (generalized): Secondary | ICD-10-CM | POA: Diagnosis not present

## 2021-02-20 DIAGNOSIS — I482 Chronic atrial fibrillation, unspecified: Secondary | ICD-10-CM | POA: Diagnosis not present

## 2021-02-21 DIAGNOSIS — Z9181 History of falling: Secondary | ICD-10-CM | POA: Diagnosis not present

## 2021-02-21 DIAGNOSIS — R2681 Unsteadiness on feet: Secondary | ICD-10-CM | POA: Diagnosis not present

## 2021-02-21 DIAGNOSIS — G309 Alzheimer's disease, unspecified: Secondary | ICD-10-CM | POA: Diagnosis not present

## 2021-02-21 DIAGNOSIS — I482 Chronic atrial fibrillation, unspecified: Secondary | ICD-10-CM | POA: Diagnosis not present

## 2021-02-21 DIAGNOSIS — M6281 Muscle weakness (generalized): Secondary | ICD-10-CM | POA: Diagnosis not present

## 2021-02-21 DIAGNOSIS — R279 Unspecified lack of coordination: Secondary | ICD-10-CM | POA: Diagnosis not present

## 2021-02-22 DIAGNOSIS — G309 Alzheimer's disease, unspecified: Secondary | ICD-10-CM | POA: Diagnosis not present

## 2021-02-22 DIAGNOSIS — I482 Chronic atrial fibrillation, unspecified: Secondary | ICD-10-CM | POA: Diagnosis not present

## 2021-02-22 DIAGNOSIS — R279 Unspecified lack of coordination: Secondary | ICD-10-CM | POA: Diagnosis not present

## 2021-02-22 DIAGNOSIS — M6281 Muscle weakness (generalized): Secondary | ICD-10-CM | POA: Diagnosis not present

## 2021-02-22 DIAGNOSIS — R2681 Unsteadiness on feet: Secondary | ICD-10-CM | POA: Diagnosis not present

## 2021-02-22 DIAGNOSIS — Z9181 History of falling: Secondary | ICD-10-CM | POA: Diagnosis not present

## 2021-02-25 DIAGNOSIS — M1990 Unspecified osteoarthritis, unspecified site: Secondary | ICD-10-CM | POA: Diagnosis not present

## 2021-02-25 DIAGNOSIS — E559 Vitamin D deficiency, unspecified: Secondary | ICD-10-CM | POA: Diagnosis not present

## 2021-02-25 DIAGNOSIS — Z7901 Long term (current) use of anticoagulants: Secondary | ICD-10-CM | POA: Diagnosis not present

## 2021-02-25 DIAGNOSIS — G309 Alzheimer's disease, unspecified: Secondary | ICD-10-CM | POA: Diagnosis not present

## 2021-02-25 DIAGNOSIS — I6523 Occlusion and stenosis of bilateral carotid arteries: Secondary | ICD-10-CM | POA: Diagnosis not present

## 2021-02-25 DIAGNOSIS — Z8546 Personal history of malignant neoplasm of prostate: Secondary | ICD-10-CM | POA: Diagnosis not present

## 2021-02-25 DIAGNOSIS — Z9181 History of falling: Secondary | ICD-10-CM | POA: Diagnosis not present

## 2021-02-25 DIAGNOSIS — I482 Chronic atrial fibrillation, unspecified: Secondary | ICD-10-CM | POA: Diagnosis not present

## 2021-02-25 DIAGNOSIS — I5032 Chronic diastolic (congestive) heart failure: Secondary | ICD-10-CM | POA: Diagnosis not present

## 2021-02-25 DIAGNOSIS — I10 Essential (primary) hypertension: Secondary | ICD-10-CM | POA: Diagnosis not present

## 2021-02-25 DIAGNOSIS — E119 Type 2 diabetes mellitus without complications: Secondary | ICD-10-CM | POA: Diagnosis not present

## 2021-02-25 DIAGNOSIS — R2681 Unsteadiness on feet: Secondary | ICD-10-CM | POA: Diagnosis not present

## 2021-02-25 DIAGNOSIS — N1832 Chronic kidney disease, stage 3b: Secondary | ICD-10-CM | POA: Diagnosis not present

## 2021-02-25 DIAGNOSIS — R279 Unspecified lack of coordination: Secondary | ICD-10-CM | POA: Diagnosis not present

## 2021-02-25 DIAGNOSIS — M6281 Muscle weakness (generalized): Secondary | ICD-10-CM | POA: Diagnosis not present

## 2021-02-25 DIAGNOSIS — I340 Nonrheumatic mitral (valve) insufficiency: Secondary | ICD-10-CM | POA: Diagnosis not present

## 2021-02-26 DIAGNOSIS — R2681 Unsteadiness on feet: Secondary | ICD-10-CM | POA: Diagnosis not present

## 2021-02-26 DIAGNOSIS — R279 Unspecified lack of coordination: Secondary | ICD-10-CM | POA: Diagnosis not present

## 2021-02-26 DIAGNOSIS — M6281 Muscle weakness (generalized): Secondary | ICD-10-CM | POA: Diagnosis not present

## 2021-02-26 DIAGNOSIS — I482 Chronic atrial fibrillation, unspecified: Secondary | ICD-10-CM | POA: Diagnosis not present

## 2021-02-26 DIAGNOSIS — I5032 Chronic diastolic (congestive) heart failure: Secondary | ICD-10-CM | POA: Diagnosis not present

## 2021-02-26 DIAGNOSIS — Z9181 History of falling: Secondary | ICD-10-CM | POA: Diagnosis not present

## 2021-02-27 DIAGNOSIS — R279 Unspecified lack of coordination: Secondary | ICD-10-CM | POA: Diagnosis not present

## 2021-02-27 DIAGNOSIS — Z9181 History of falling: Secondary | ICD-10-CM | POA: Diagnosis not present

## 2021-02-27 DIAGNOSIS — I482 Chronic atrial fibrillation, unspecified: Secondary | ICD-10-CM | POA: Diagnosis not present

## 2021-02-27 DIAGNOSIS — M6281 Muscle weakness (generalized): Secondary | ICD-10-CM | POA: Diagnosis not present

## 2021-02-27 DIAGNOSIS — I5032 Chronic diastolic (congestive) heart failure: Secondary | ICD-10-CM | POA: Diagnosis not present

## 2021-02-27 DIAGNOSIS — R2681 Unsteadiness on feet: Secondary | ICD-10-CM | POA: Diagnosis not present

## 2021-02-28 DIAGNOSIS — B351 Tinea unguium: Secondary | ICD-10-CM | POA: Diagnosis not present

## 2021-02-28 DIAGNOSIS — R2681 Unsteadiness on feet: Secondary | ICD-10-CM | POA: Diagnosis not present

## 2021-02-28 DIAGNOSIS — M2042 Other hammer toe(s) (acquired), left foot: Secondary | ICD-10-CM | POA: Diagnosis not present

## 2021-02-28 DIAGNOSIS — I482 Chronic atrial fibrillation, unspecified: Secondary | ICD-10-CM | POA: Diagnosis not present

## 2021-02-28 DIAGNOSIS — R279 Unspecified lack of coordination: Secondary | ICD-10-CM | POA: Diagnosis not present

## 2021-02-28 DIAGNOSIS — M2041 Other hammer toe(s) (acquired), right foot: Secondary | ICD-10-CM | POA: Diagnosis not present

## 2021-02-28 DIAGNOSIS — Z9181 History of falling: Secondary | ICD-10-CM | POA: Diagnosis not present

## 2021-02-28 DIAGNOSIS — M6281 Muscle weakness (generalized): Secondary | ICD-10-CM | POA: Diagnosis not present

## 2021-02-28 DIAGNOSIS — L603 Nail dystrophy: Secondary | ICD-10-CM | POA: Diagnosis not present

## 2021-02-28 DIAGNOSIS — I739 Peripheral vascular disease, unspecified: Secondary | ICD-10-CM | POA: Diagnosis not present

## 2021-02-28 DIAGNOSIS — I5032 Chronic diastolic (congestive) heart failure: Secondary | ICD-10-CM | POA: Diagnosis not present

## 2021-03-01 DIAGNOSIS — I482 Chronic atrial fibrillation, unspecified: Secondary | ICD-10-CM | POA: Diagnosis not present

## 2021-03-01 DIAGNOSIS — M6281 Muscle weakness (generalized): Secondary | ICD-10-CM | POA: Diagnosis not present

## 2021-03-01 DIAGNOSIS — Z9181 History of falling: Secondary | ICD-10-CM | POA: Diagnosis not present

## 2021-03-01 DIAGNOSIS — I5032 Chronic diastolic (congestive) heart failure: Secondary | ICD-10-CM | POA: Diagnosis not present

## 2021-03-01 DIAGNOSIS — R279 Unspecified lack of coordination: Secondary | ICD-10-CM | POA: Diagnosis not present

## 2021-03-01 DIAGNOSIS — R2681 Unsteadiness on feet: Secondary | ICD-10-CM | POA: Diagnosis not present

## 2021-03-04 DIAGNOSIS — R279 Unspecified lack of coordination: Secondary | ICD-10-CM | POA: Diagnosis not present

## 2021-03-04 DIAGNOSIS — I5032 Chronic diastolic (congestive) heart failure: Secondary | ICD-10-CM | POA: Diagnosis not present

## 2021-03-04 DIAGNOSIS — R2681 Unsteadiness on feet: Secondary | ICD-10-CM | POA: Diagnosis not present

## 2021-03-04 DIAGNOSIS — I482 Chronic atrial fibrillation, unspecified: Secondary | ICD-10-CM | POA: Diagnosis not present

## 2021-03-04 DIAGNOSIS — Z9181 History of falling: Secondary | ICD-10-CM | POA: Diagnosis not present

## 2021-03-04 DIAGNOSIS — M6281 Muscle weakness (generalized): Secondary | ICD-10-CM | POA: Diagnosis not present

## 2021-03-05 DIAGNOSIS — R2681 Unsteadiness on feet: Secondary | ICD-10-CM | POA: Diagnosis not present

## 2021-03-05 DIAGNOSIS — I482 Chronic atrial fibrillation, unspecified: Secondary | ICD-10-CM | POA: Diagnosis not present

## 2021-03-05 DIAGNOSIS — Z9181 History of falling: Secondary | ICD-10-CM | POA: Diagnosis not present

## 2021-03-05 DIAGNOSIS — M6281 Muscle weakness (generalized): Secondary | ICD-10-CM | POA: Diagnosis not present

## 2021-03-05 DIAGNOSIS — I5032 Chronic diastolic (congestive) heart failure: Secondary | ICD-10-CM | POA: Diagnosis not present

## 2021-03-05 DIAGNOSIS — R279 Unspecified lack of coordination: Secondary | ICD-10-CM | POA: Diagnosis not present

## 2021-03-13 NOTE — Progress Notes (Signed)
HPI: FU coronary disease, atrial fibrillation and CHF. He is status post bypass surgery in 2000. He also has mild-to-moderate carotid stenosis, hypertension, borderline DM2, hyperlipidemia and prostate cancer. He had a cardiac catheterization in May 2008 which showed stable revascularization. Carotid Dopplers in Oct 2015 showed no significant stenosis. Also with h/o atrial fibrillation. We started apixaban and cardioversion was attempted but was unsuccessful. Nuclear study November 2021 showed ejection fraction 67% with normal perfusion.  Echocardiogram August 2022 showed ejection fraction 55% with inferior basal hypokinesis, moderate biatrial enlargement, moderate mitral regurgitation, mild to moderate tricuspid regurgitation.  Monitor August 2022 showed atrial fibrillation with PVCs or aberrantly conducted beats; occasional bradycardia but no pauses greater than 3 seconds.  Since last seen, patient continues to have episodes of presyncope/syncope.  No preceding symptoms.  Following the events he is short of breath.  He denies chest pain, increased dyspnea; mild pedal edema.  Current Outpatient Medications  Medication Sig Dispense Refill   apixaban (ELIQUIS) 2.5 MG TABS tablet Take 1 tablet (2.5 mg total) by mouth 2 (two) times daily. 180 tablet 3   atorvastatin (LIPITOR) 20 MG tablet TAKE 1 TABLET DAILY AT 6 P.M. 90 tablet 3   Cholecalciferol (D3-50 PO) Take 50 mcg by mouth at bedtime.     furosemide (LASIX) 20 MG tablet Take 2 tablets (40 mg total) by mouth daily. 90 tablet 3   Ginkgo Biloba 120 MG CAPS Take 120 mg by mouth every evening.     Glucos-MSM-C-Mn-Ginger-Willow (GLUCOSAMINE MSM COMPLEX PO) Take 1 tablet by mouth daily.     hydroxypropyl methylcellulose / hypromellose (ISOPTO TEARS / GONIOVISC) 2.5 % ophthalmic solution Place 1 drop into both eyes 3 (three) times daily as needed for dry eyes.     LOSARTAN POTASSIUM PO Take 50 mg by mouth. 01/01/21 on hold right now     Multiple  Vitamin (MULTIVITAMIN) tablet Take 1 tablet by mouth daily. senior     ofloxacin (OCUFLOX) 0.3 % ophthalmic solution Place 1 drop into both eyes daily.     tamsulosin (FLOMAX) 0.4 MG CAPS capsule Take 0.4 mg by mouth every evening.     vitamin B-12 (CYANOCOBALAMIN) 500 MCG tablet Take 500 mcg by mouth daily.     zinc gluconate 50 MG tablet Take 50 mg by mouth daily.     No current facility-administered medications for this visit.     Past Medical History:  Diagnosis Date   Carotid stenosis    10/2008: R 0-39% L 40-59%     07/2009: L ICA < 50%  L ECA high-grade  R mild plaque   Coronary artery disease    cath may 2008; stable   Diabetes mellitus type 2, diet-controlled (HCC)    Hearing loss    History of colonic polyps    History of prostate cancer    Hyperlipidemia    Hypertension    Memory change    Osteoarthritis    PVD (peripheral vascular disease) (HCC)    Tubular adenoma of colon    history of     Past Surgical History:  Procedure Laterality Date   CARDIOVERSION N/A 01/03/2019   Procedure: CARDIOVERSION;  Surgeon: Sanda Klein, MD;  Location: [facility];  Service: Cardiovascular;  Laterality: N/A;   CORONARY ARTERY BYPASS GRAFT  2000   INSERTION PROSTATE RADIATION SEED  2008    Social History   Socioeconomic History   Marital status: Married    Spouse name: Not on file  Number of children: 1   Years of education: Not on file   Highest education level: 11th grade  Occupational History   Not on file  Tobacco Use   Smoking status: Former   Smokeless tobacco: Never  Substance and Sexual Activity   Alcohol use: No   Drug use: No   Sexual activity: Not on file  Other Topics Concern   Not on file  Social History Narrative   01/01/21 living with dgtr [name]   Social Determinants of Health   Financial Resource Strain: Not on file  Food Insecurity: Not on file  Transportation Needs: Not on file  Physical Activity: Not on file  Stress: Not on file  Social  Connections: Not on file  Intimate Partner Violence: Not on file    Family History  Problem Relation Age of Onset   Congestive Heart Failure Father    Heart disease Father    Breast cancer Sister    Stomach cancer Cousin    Diabetes Cousin    Diabetes Other    Heart disease Other    Prostate cancer Other    Heart disease Other    Colon cancer Neg Hx     ROS: no fevers or chills, productive cough, hemoptysis, dysphasia, odynophagia, melena, hematochezia, dysuria, hematuria, rash, seizure activity, orthopnea, PND, pedal edema, claudication. Remaining systems are negative.  Physical Exam: Well-developed well-nourished in no acute distress.  Skin is warm and dry.  HEENT is normal.  Neck is supple.  Chest is clear to auscultation with normal expansion.  Cardiovascular exam is irregular and bradycardic Abdominal exam nontender or distended. No masses palpated. Extremities show trace edema. neuro grossly intact  ECG-atrial fibrillation at a rate of 42, anterior infarct.  Personally reviewed  A/P  1 permanent atrial fibrillation-previous monitor showed heart rate is controlled with no significant bradycardia.  However he continues to have episodes of presyncope/syncope.  His heart rate is slow today.  I am concerned that he is symptoms potentially are bradycardia mediated.  I think he needs an implantable loop monitor and I will ask electrophysiology to evaluate.  He states he would be agreeable to a pacemaker if pauses were correlated with his symptoms.  Echocardiogram shows preserved LV function.  We will continue to avoid all AV nodal blocking agents.  Continue apixaban.  I again discussed the potential for discontinuing his anticoagulation as he has had falls.  However he is in a nursing home now and his daughter states he is being watched very carefully.  We will continue for now but can consider discontinuing in the future if necessary.  2 coronary artery disease-no chest pain.   Continue statin.  He is not on aspirin given need for anticoagulation.  3 hypertension-blood pressure controlled.  Continue present medical regimen.  4 hyperlipidemia-continue statin.  5 chronic diastolic congestive heart failure-volume status has improved somewhat since last office visit.  Continue diuretic at present dose.  6 moderate mitral regurgitation-we will plan conservative measures given patient's age and overall medical condition.  7 carotid artery disease-mild on most recent Dopplers.  8 history of bradycardia-continue to avoid all AV nodal blocking agents.  Kirk Ruths, MD

## 2021-03-15 DIAGNOSIS — Z79899 Other long term (current) drug therapy: Secondary | ICD-10-CM | POA: Diagnosis not present

## 2021-03-20 ENCOUNTER — Other Ambulatory Visit: Payer: Self-pay

## 2021-03-20 ENCOUNTER — Encounter: Payer: Self-pay | Admitting: Cardiology

## 2021-03-20 ENCOUNTER — Ambulatory Visit (INDEPENDENT_AMBULATORY_CARE_PROVIDER_SITE_OTHER): Payer: Medicare Other | Admitting: Cardiology

## 2021-03-20 VITALS — BP 143/64 | HR 42 | Ht 70.0 in | Wt 176.0 lb

## 2021-03-20 DIAGNOSIS — I2510 Atherosclerotic heart disease of native coronary artery without angina pectoris: Secondary | ICD-10-CM

## 2021-03-20 DIAGNOSIS — I10 Essential (primary) hypertension: Secondary | ICD-10-CM | POA: Diagnosis not present

## 2021-03-20 DIAGNOSIS — E785 Hyperlipidemia, unspecified: Secondary | ICD-10-CM | POA: Diagnosis not present

## 2021-03-20 DIAGNOSIS — I4821 Permanent atrial fibrillation: Secondary | ICD-10-CM | POA: Diagnosis not present

## 2021-03-20 DIAGNOSIS — R55 Syncope and collapse: Secondary | ICD-10-CM | POA: Diagnosis not present

## 2021-03-20 NOTE — Patient Instructions (Signed)
  Follow-Up: At [facility], you and your health needs are our priority.  As part of our continuing mission to provide you with exceptional heart care, we have created designated Provider Care Teams.  These Care Teams include your primary Cardiologist (physician) and Advanced Practice Providers (APPs -  Physician Assistants and Nurse Practitioners) who all work together to provide you with the care you need, when you need it.  We recommend signing up for the patient portal called "MyChart".  Sign up information is provided on this After Visit Summary.  MyChart is used to connect with patients for Virtual Visits (Telemedicine).  Patients are able to view lab/test results, encounter notes, upcoming appointments, etc.  Non-urgent messages can be sent to your provider as well.   To learn more about what you can do with MyChart, go to [email or web address].    Your next appointment:   3 month(s)  The format for your next appointment:   In Person  Provider:   Brian Crenshaw, MD    

## 2021-03-24 DIAGNOSIS — I480 Paroxysmal atrial fibrillation: Secondary | ICD-10-CM | POA: Diagnosis not present

## 2021-03-24 DIAGNOSIS — I951 Orthostatic hypotension: Secondary | ICD-10-CM | POA: Diagnosis not present

## 2021-03-24 DIAGNOSIS — G309 Alzheimer's disease, unspecified: Secondary | ICD-10-CM | POA: Diagnosis not present

## 2021-03-27 DIAGNOSIS — G309 Alzheimer's disease, unspecified: Secondary | ICD-10-CM | POA: Diagnosis not present

## 2021-03-27 DIAGNOSIS — F03918 Unspecified dementia, unspecified severity, with other behavioral disturbance: Secondary | ICD-10-CM | POA: Diagnosis not present

## 2021-03-27 DIAGNOSIS — F5101 Primary insomnia: Secondary | ICD-10-CM | POA: Diagnosis not present

## 2021-03-27 DIAGNOSIS — F29 Unspecified psychosis not due to a substance or known physiological condition: Secondary | ICD-10-CM | POA: Diagnosis not present

## 2021-04-09 ENCOUNTER — Encounter (HOSPITAL_COMMUNITY)
Admission: EM | Disposition: A | Discharge status: Home / Self Care | Payer: Self-pay | Source: Home / Self Care | Attending: Emergency Medicine

## 2021-04-09 ENCOUNTER — Emergency Department (HOSPITAL_COMMUNITY): Payer: Medicare Other | Admitting: Anesthesiology

## 2021-04-09 ENCOUNTER — Observation Stay (HOSPITAL_COMMUNITY)
Admission: EM | Admit: 2021-04-09 | Discharge: 2021-04-10 | Disposition: A | Discharge status: Home / Self Care | Payer: Medicare Other | Attending: Internal Medicine | Admitting: Internal Medicine

## 2021-04-09 ENCOUNTER — Encounter (HOSPITAL_COMMUNITY): Payer: Self-pay

## 2021-04-09 ENCOUNTER — Emergency Department (HOSPITAL_COMMUNITY): Payer: Medicare Other

## 2021-04-09 DIAGNOSIS — S68625A Partial traumatic transphalangeal amputation of left ring finger, initial encounter: Secondary | ICD-10-CM | POA: Diagnosis not present

## 2021-04-09 DIAGNOSIS — S0990XA Unspecified injury of head, initial encounter: Secondary | ICD-10-CM | POA: Diagnosis not present

## 2021-04-09 DIAGNOSIS — Z20822 Contact with and (suspected) exposure to covid-19: Secondary | ICD-10-CM | POA: Diagnosis not present

## 2021-04-09 DIAGNOSIS — I480 Paroxysmal atrial fibrillation: Secondary | ICD-10-CM | POA: Diagnosis not present

## 2021-04-09 DIAGNOSIS — E785 Hyperlipidemia, unspecified: Secondary | ICD-10-CM | POA: Diagnosis not present

## 2021-04-09 DIAGNOSIS — E7800 Pure hypercholesterolemia, unspecified: Secondary | ICD-10-CM | POA: Diagnosis not present

## 2021-04-09 DIAGNOSIS — G9341 Metabolic encephalopathy: Secondary | ICD-10-CM | POA: Diagnosis present

## 2021-04-09 DIAGNOSIS — S68629A Partial traumatic transphalangeal amputation of unspecified finger, initial encounter: Secondary | ICD-10-CM | POA: Diagnosis not present

## 2021-04-09 DIAGNOSIS — Z79899 Other long term (current) drug therapy: Secondary | ICD-10-CM | POA: Diagnosis not present

## 2021-04-09 DIAGNOSIS — Z7901 Long term (current) use of anticoagulants: Secondary | ICD-10-CM

## 2021-04-09 DIAGNOSIS — T8789 Other complications of amputation stump: Secondary | ICD-10-CM | POA: Diagnosis not present

## 2021-04-09 DIAGNOSIS — S68119A Complete traumatic metacarpophalangeal amputation of unspecified finger, initial encounter: Secondary | ICD-10-CM | POA: Diagnosis not present

## 2021-04-09 DIAGNOSIS — T884XXD Failed or difficult intubation, subsequent encounter: Secondary | ICD-10-CM | POA: Diagnosis not present

## 2021-04-09 DIAGNOSIS — E1122 Type 2 diabetes mellitus with diabetic chronic kidney disease: Secondary | ICD-10-CM | POA: Insufficient documentation

## 2021-04-09 DIAGNOSIS — S61412A Laceration without foreign body of left hand, initial encounter: Secondary | ICD-10-CM | POA: Diagnosis not present

## 2021-04-09 DIAGNOSIS — N1831 Chronic kidney disease, stage 3a: Secondary | ICD-10-CM | POA: Diagnosis not present

## 2021-04-09 DIAGNOSIS — Z01818 Encounter for other preprocedural examination: Secondary | ICD-10-CM

## 2021-04-09 DIAGNOSIS — Z87891 Personal history of nicotine dependence: Secondary | ICD-10-CM | POA: Diagnosis not present

## 2021-04-09 DIAGNOSIS — N183 Chronic kidney disease, stage 3 unspecified: Secondary | ICD-10-CM | POA: Diagnosis present

## 2021-04-09 DIAGNOSIS — R2681 Unsteadiness on feet: Secondary | ICD-10-CM | POA: Insufficient documentation

## 2021-04-09 DIAGNOSIS — F03B0 Unspecified dementia, moderate, without behavioral disturbance, psychotic disturbance, mood disturbance, and anxiety: Secondary | ICD-10-CM | POA: Diagnosis not present

## 2021-04-09 DIAGNOSIS — I739 Peripheral vascular disease, unspecified: Secondary | ICD-10-CM | POA: Diagnosis not present

## 2021-04-09 DIAGNOSIS — J90 Pleural effusion, not elsewhere classified: Secondary | ICD-10-CM | POA: Diagnosis not present

## 2021-04-09 DIAGNOSIS — I2510 Atherosclerotic heart disease of native coronary artery without angina pectoris: Secondary | ICD-10-CM | POA: Insufficient documentation

## 2021-04-09 DIAGNOSIS — W19XXXA Unspecified fall, initial encounter: Secondary | ICD-10-CM | POA: Insufficient documentation

## 2021-04-09 DIAGNOSIS — I4891 Unspecified atrial fibrillation: Secondary | ICD-10-CM | POA: Diagnosis not present

## 2021-04-09 DIAGNOSIS — Z23 Encounter for immunization: Secondary | ICD-10-CM | POA: Diagnosis not present

## 2021-04-09 DIAGNOSIS — Z4659 Encounter for fitting and adjustment of other gastrointestinal appliance and device: Secondary | ICD-10-CM

## 2021-04-09 DIAGNOSIS — I129 Hypertensive chronic kidney disease with stage 1 through stage 4 chronic kidney disease, or unspecified chronic kidney disease: Secondary | ICD-10-CM | POA: Insufficient documentation

## 2021-04-09 DIAGNOSIS — I10 Essential (primary) hypertension: Secondary | ICD-10-CM | POA: Diagnosis not present

## 2021-04-09 DIAGNOSIS — Z951 Presence of aortocoronary bypass graft: Secondary | ICD-10-CM

## 2021-04-09 DIAGNOSIS — R55 Syncope and collapse: Secondary | ICD-10-CM | POA: Insufficient documentation

## 2021-04-09 DIAGNOSIS — R0902 Hypoxemia: Secondary | ICD-10-CM | POA: Diagnosis not present

## 2021-04-09 DIAGNOSIS — J9811 Atelectasis: Secondary | ICD-10-CM | POA: Diagnosis not present

## 2021-04-09 DIAGNOSIS — F0390 Unspecified dementia without behavioral disturbance: Secondary | ICD-10-CM | POA: Diagnosis not present

## 2021-04-09 DIAGNOSIS — Z043 Encounter for examination and observation following other accident: Secondary | ICD-10-CM | POA: Diagnosis not present

## 2021-04-09 DIAGNOSIS — R58 Hemorrhage, not elsewhere classified: Secondary | ICD-10-CM | POA: Diagnosis not present

## 2021-04-09 DIAGNOSIS — S6992XA Unspecified injury of left wrist, hand and finger(s), initial encounter: Secondary | ICD-10-CM | POA: Diagnosis present

## 2021-04-09 HISTORY — PX: AMPUTATION: SHX166

## 2021-04-09 LAB — BASIC METABOLIC PANEL
Anion gap: 11 (ref 5–15)
BUN: 40 mg/dL — ABNORMAL HIGH (ref 8–23)
CO2: 26 mmol/L (ref 22–32)
Calcium: 9.4 mg/dL (ref 8.9–10.3)
Chloride: 100 mmol/L (ref 98–111)
Creatinine, Ser: 1.43 mg/dL — ABNORMAL HIGH (ref 0.61–1.24)
GFR, Estimated: 47 mL/min — ABNORMAL LOW (ref >60)
Glucose, Bld: 106 mg/dL — ABNORMAL HIGH (ref 70–99)
Potassium: 4.1 mmol/L (ref 3.5–5.1)
Sodium: 137 mmol/L (ref 135–145)

## 2021-04-09 LAB — CBC WITH DIFFERENTIAL/PLATELET
Abs Immature Granulocytes: 0.02 10*3/uL (ref 0.00–0.07)
Basophils Absolute: 0 10*3/uL (ref 0.0–0.1)
Basophils Relative: 1 %
Eosinophils Absolute: 0 10*3/uL (ref 0.0–0.5)
Eosinophils Relative: 1 %
HCT: 37.7 % — ABNORMAL LOW (ref 39.0–52.0)
Hemoglobin: 11.7 g/dL — ABNORMAL LOW (ref 13.0–17.0)
Immature Granulocytes: 0 %
Lymphocytes Relative: 20 %
Lymphs Abs: 1.3 10*3/uL (ref 0.7–4.0)
MCH: 29.1 pg (ref 26.0–34.0)
MCHC: 31 g/dL (ref 30.0–36.0)
MCV: 93.8 fL (ref 80.0–100.0)
Monocytes Absolute: 0.6 10*3/uL (ref 0.1–1.0)
Monocytes Relative: 10 %
Neutro Abs: 4.3 10*3/uL (ref 1.7–7.7)
Neutrophils Relative %: 68 %
Platelets: 148 10*3/uL — ABNORMAL LOW (ref 150–400)
RBC: 4.02 MIL/uL — ABNORMAL LOW (ref 4.22–5.81)
RDW: 15.7 % — ABNORMAL HIGH (ref 11.5–15.5)
WBC: 6.3 10*3/uL (ref 4.0–10.5)
nRBC: 0 % (ref 0.0–0.2)

## 2021-04-09 LAB — SURGICAL PCR SCREEN
MRSA, PCR: NEGATIVE
Staphylococcus aureus: NEGATIVE

## 2021-04-09 LAB — RESP PANEL BY RT-PCR (FLU A&B, COVID) ARPGX2
Influenza A by PCR: NEGATIVE
Influenza B by PCR: NEGATIVE
SARS Coronavirus 2 by RT PCR: NEGATIVE

## 2021-04-09 LAB — GLUCOSE, CAPILLARY: Glucose-Capillary: 113 mg/dL — ABNORMAL HIGH (ref 70–99)

## 2021-04-09 SURGERY — AMPUTATION DIGIT
Anesthesia: Monitor Anesthesia Care | Laterality: Left

## 2021-04-09 MED ORDER — TETANUS-DIPHTH-ACELL PERTUSSIS 5-2.5-18.5 LF-MCG/0.5 IM SUSY
0.5000 mL | PREFILLED_SYRINGE | Freq: Once | INTRAMUSCULAR | Status: AC
  Administered 2021-04-09: 0.5 mL via INTRAMUSCULAR
  Filled 2021-04-09: qty 0.5

## 2021-04-09 MED ORDER — HYDROXYZINE PAMOATE 25 MG PO CAPS: 25.0000 mg | ORAL_CAPSULE | Freq: Every day | ORAL | Status: DC

## 2021-04-09 MED ORDER — TETANUS-DIPHTHERIA TOXOIDS TD 5-2 LFU IM INJ
0.5000 mL | INJECTION | Freq: Once | INTRAMUSCULAR | Status: DC
Start: 2021-04-09 — End: 2021-04-09

## 2021-04-09 MED ORDER — HYDRALAZINE HCL 20 MG/ML IJ SOLN
INTRAMUSCULAR | Status: AC
  Filled 2021-04-09: qty 1

## 2021-04-09 MED ORDER — CEFAZOLIN SODIUM-DEXTROSE 2-4 GM/100ML-% IV SOLN
2.0000 g | INTRAVENOUS | Status: AC
  Administered 2021-04-09: 2 g via INTRAVENOUS

## 2021-04-09 MED ORDER — HYDROXYZINE HCL 25 MG PO TABS
25.0000 mg | ORAL_TABLET | Freq: Every day | ORAL | Status: DC
  Filled 2021-04-09: qty 1

## 2021-04-09 MED ORDER — LOSARTAN POTASSIUM 50 MG PO TABS
50.0000 mg | ORAL_TABLET | Freq: Two times a day (BID) | ORAL | Status: DC
  Filled 2021-04-09 (×2): qty 1

## 2021-04-09 MED ORDER — ONDANSETRON HCL 4 MG/2ML IJ SOLN: 4.0000 mg | Freq: Four times a day (QID) | INTRAMUSCULAR | Status: DC | PRN

## 2021-04-09 MED ORDER — ACETAMINOPHEN 325 MG PO TABS
650.0000 mg | ORAL_TABLET | Freq: Four times a day (QID) | ORAL | Status: DC | PRN
  Filled 2021-04-09 (×2): qty 2

## 2021-04-09 MED ORDER — MEMANTINE HCL 5 MG PO TABS
5.0000 mg | ORAL_TABLET | Freq: Every day | ORAL | Status: DC
  Filled 2021-04-09 (×2): qty 1

## 2021-04-09 MED ORDER — LACTATED RINGERS IV SOLN: INTRAVENOUS | Status: DC

## 2021-04-09 MED ORDER — ACETAMINOPHEN 500 MG PO TABS: 1000.0000 mg | ORAL_TABLET | Freq: Once | ORAL | Status: DC | PRN

## 2021-04-09 MED ORDER — FENTANYL CITRATE (PF) 100 MCG/2ML IJ SOLN
25.0000 ug | INTRAMUSCULAR | Status: DC | PRN
  Administered 2021-04-09 (×2): 25 ug via INTRAVENOUS

## 2021-04-09 MED ORDER — CHLORHEXIDINE GLUCONATE 0.12 % MT SOLN
OROMUCOSAL | Status: AC
  Administered 2021-04-09: 15 mL via OROMUCOSAL
  Filled 2021-04-09: qty 15

## 2021-04-09 MED ORDER — HYDRALAZINE HCL 20 MG/ML IJ SOLN
5.0000 mg | INTRAMUSCULAR | Status: DC | PRN
  Administered 2021-04-09: 5 mg via INTRAVENOUS

## 2021-04-09 MED ORDER — PROPOFOL 10 MG/ML IV BOLUS: INTRAVENOUS | Status: DC | PRN

## 2021-04-09 MED ORDER — ORAL CARE MOUTH RINSE: 15.0000 mL | Freq: Once | OROMUCOSAL | Status: AC

## 2021-04-09 MED ORDER — ADULT MULTIVITAMIN W/MINERALS CH
1.0000 | ORAL_TABLET | Freq: Every day | ORAL | Status: DC
  Filled 2021-04-09: qty 1

## 2021-04-09 MED ORDER — ACETAMINOPHEN 650 MG RE SUPP: 650.0000 mg | Freq: Four times a day (QID) | RECTAL | Status: DC | PRN

## 2021-04-09 MED ORDER — CEFAZOLIN SODIUM-DEXTROSE 2-4 GM/100ML-% IV SOLN
INTRAVENOUS | Status: AC
  Filled 2021-04-09: qty 100

## 2021-04-09 MED ORDER — VITAMIN D 25 MCG (1000 UNIT) PO TABS
2000.0000 [IU] | ORAL_TABLET | Freq: Every day | ORAL | Status: DC
  Filled 2021-04-09: qty 2

## 2021-04-09 MED ORDER — ZINC SULFATE 220 (50 ZN) MG PO CAPS
220.0000 mg | ORAL_CAPSULE | Freq: Every day | ORAL | Status: DC
  Filled 2021-04-09: qty 1

## 2021-04-09 MED ORDER — HYDRALAZINE HCL 20 MG/ML IJ SOLN
10.0000 mg | INTRAMUSCULAR | Status: DC | PRN
  Administered 2021-04-09: 15 mg via INTRAVENOUS

## 2021-04-09 MED ORDER — TRAMADOL HCL 50 MG PO TABS: ORAL_TABLET | ORAL | 0 refills | Status: AC

## 2021-04-09 MED ORDER — BUPIVACAINE HCL (PF) 0.25 % IJ SOLN
INTRAMUSCULAR | Status: AC
  Filled 2021-04-09: qty 30

## 2021-04-09 MED ORDER — FENTANYL CITRATE (PF) 100 MCG/2ML IJ SOLN: 25.0000 ug | INTRAMUSCULAR | Status: DC | PRN

## 2021-04-09 MED ORDER — OXYCODONE HCL 5 MG/5ML PO SOLN: 5.0000 mg | Freq: Once | ORAL | Status: DC | PRN

## 2021-04-09 MED ORDER — ACETAMINOPHEN 160 MG/5ML PO SOLN: 1000.0000 mg | Freq: Once | ORAL | Status: DC | PRN

## 2021-04-09 MED ORDER — PROPOFOL 500 MG/50ML IV EMUL
INTRAVENOUS | Status: DC | PRN
  Administered 2021-04-09: 75 ug/kg/min via INTRAVENOUS
  Administered 2021-04-09: 50 ug/kg/min via INTRAVENOUS

## 2021-04-09 MED ORDER — ACETAMINOPHEN 10 MG/ML IV SOLN: 1000.0000 mg | Freq: Once | INTRAVENOUS | Status: DC | PRN

## 2021-04-09 MED ORDER — CHLORHEXIDINE GLUCONATE 0.12 % MT SOLN: 15.0000 mL | Freq: Once | OROMUCOSAL | Status: AC

## 2021-04-09 MED ORDER — ATORVASTATIN CALCIUM 10 MG PO TABS
20.0000 mg | ORAL_TABLET | Freq: Every day | ORAL | Status: DC
  Filled 2021-04-09: qty 2

## 2021-04-09 MED ORDER — CHLORHEXIDINE GLUCONATE 4 % EX LIQD: 60.0000 mL | Freq: Once | CUTANEOUS | Status: DC

## 2021-04-09 MED ORDER — FENTANYL CITRATE (PF) 100 MCG/2ML IJ SOLN
INTRAMUSCULAR | Status: AC
  Filled 2021-04-09: qty 2

## 2021-04-09 MED ORDER — LACTATED RINGERS IV SOLN: INTRAVENOUS | Status: DC | PRN

## 2021-04-09 MED ORDER — ONDANSETRON HCL 4 MG/2ML IJ SOLN: 4.0000 mg | Freq: Once | INTRAMUSCULAR | Status: DC | PRN

## 2021-04-09 MED ORDER — FENTANYL CITRATE (PF) 250 MCG/5ML IJ SOLN
INTRAMUSCULAR | Status: AC
  Filled 2021-04-09: qty 5

## 2021-04-09 MED ORDER — FENTANYL CITRATE (PF) 250 MCG/5ML IJ SOLN
INTRAMUSCULAR | Status: DC | PRN
  Administered 2021-04-09: 25 ug via INTRAVENOUS

## 2021-04-09 MED ORDER — BUPIVACAINE HCL 0.25 % IJ SOLN
INTRAMUSCULAR | Status: DC | PRN
  Administered 2021-04-09: 9 mL

## 2021-04-09 MED ORDER — CYANOCOBALAMIN 500 MCG PO TABS
500.0000 ug | ORAL_TABLET | Freq: Every day | ORAL | Status: DC
  Filled 2021-04-09: qty 1

## 2021-04-09 MED ORDER — POVIDONE-IODINE 10 % EX SWAB
2.0000 "application " | Freq: Once | CUTANEOUS | Status: AC
  Administered 2021-04-09: 2 via TOPICAL

## 2021-04-09 MED ORDER — ONDANSETRON HCL 4 MG PO TABS: 4.0000 mg | ORAL_TABLET | Freq: Four times a day (QID) | ORAL | Status: DC | PRN

## 2021-04-09 MED ORDER — OXYCODONE HCL 5 MG PO TABS: 5.0000 mg | ORAL_TABLET | Freq: Once | ORAL | Status: DC | PRN

## 2021-04-09 MED ORDER — GINKGO BILOBA 120 MG PO CAPS: 120.0000 mg | ORAL_CAPSULE | Freq: Every day | ORAL | Status: DC

## 2021-04-09 MED ORDER — FUROSEMIDE 40 MG PO TABS
40.0000 mg | ORAL_TABLET | Freq: Every day | ORAL | Status: DC
  Filled 2021-04-09: qty 1

## 2021-04-09 SURGICAL SUPPLY — 32 items
BAG COUNTER SPONGE SURGICOUNT (BAG) ×2 IMPLANT
BAG SURGICOUNT SPONGE COUNTING (BAG) ×1
BNDG COHESIVE 1X5 TAN STRL LF (GAUZE/BANDAGES/DRESSINGS) ×3 IMPLANT
BNDG ESMARK 4X9 LF (GAUZE/BANDAGES/DRESSINGS) ×3 IMPLANT
BNDG GAUZE ELAST 4 BULKY (GAUZE/BANDAGES/DRESSINGS) IMPLANT
CORD BIPOLAR FORCEPS 12FT (ELECTRODE) ×3 IMPLANT
CUFF TOURN SGL QUICK 18X4 (TOURNIQUET CUFF) ×3 IMPLANT
CUFF TOURN SGL QUICK 24 (TOURNIQUET CUFF) ×3
CUFF TRNQT CYL 24X4X16.5-23 (TOURNIQUET CUFF) ×1 IMPLANT
DURAPREP 26ML APPLICATOR (WOUND CARE) ×3 IMPLANT
GAUZE SPONGE 4X4 12PLY STRL (GAUZE/BANDAGES/DRESSINGS) IMPLANT
GAUZE XEROFORM 1X8 LF (GAUZE/BANDAGES/DRESSINGS) ×3 IMPLANT
GLOVE SRG 8 PF TXTR STRL LF DI (GLOVE) ×1 IMPLANT
GLOVE SURG ENC MOIS LTX SZ7.5 (GLOVE) ×3 IMPLANT
GLOVE SURG UNDER POLY LF SZ8 (GLOVE) ×3
GOWN STRL REUS W/ TWL LRG LVL3 (GOWN DISPOSABLE) ×1 IMPLANT
GOWN STRL REUS W/ TWL XL LVL3 (GOWN DISPOSABLE) ×1 IMPLANT
GOWN STRL REUS W/TWL LRG LVL3 (GOWN DISPOSABLE) ×3
GOWN STRL REUS W/TWL XL LVL3 (GOWN DISPOSABLE) ×3
KIT BASIN OR (CUSTOM PROCEDURE TRAY) ×3 IMPLANT
KIT TURNOVER KIT B (KITS) ×3 IMPLANT
NEEDLE HYPO 25GX1X1/2 BEV (NEEDLE) ×3 IMPLANT
NS IRRIG 1000ML POUR BTL (IV SOLUTION) ×3 IMPLANT
PACK ORTHO EXTREMITY (CUSTOM PROCEDURE TRAY) ×3 IMPLANT
PAD ARMBOARD 7.5X6 YLW CONV (MISCELLANEOUS) ×6 IMPLANT
PAD CAST 4YDX4 CTTN HI CHSV (CAST SUPPLIES) IMPLANT
PADDING CAST COTTON 4X4 STRL (CAST SUPPLIES)
SPECIMEN JAR SMALL (MISCELLANEOUS) ×3 IMPLANT
SUT CHROMIC 6 0 PS 4 (SUTURE) ×3 IMPLANT
SYR CONTROL 10ML LL (SYRINGE) ×3 IMPLANT
TOWEL GREEN STERILE (TOWEL DISPOSABLE) ×3 IMPLANT
UNDERPAD 30X36 HEAVY ABSORB (UNDERPADS AND DIAPERS) ×3 IMPLANT

## 2021-04-09 NOTE — ED Triage Notes (Signed)
Lives in [facility] home. Mechanical fall where pt head hit the table. No LOC. Pt has a laceration to left hand and left hand 3rd finger wound. Alert and oriented x 4.

## 2021-04-09 NOTE — ED Notes (Signed)
Pt attempted to remove his gown and EKG leads. Replaced by RN at this time. Pt is redirectable. Checked and changed into a new gown as pt has removed the bandage to left hand, 3rd finger twice. Will continue to monitor.

## 2021-04-09 NOTE — Discharge Instructions (Signed)

## 2021-04-09 NOTE — Consult Note (Addendum)
Reason for Consult:Left ring finger amputation Referring Physician: Dene Gentry Time called: [id number] Time at bedside: [place] is an 87 y.o. male.  HPI: Allen Anderson fell at the [facility] where he [facility]. No one is quite sure what happened but he managed to catch his left ring finger on something and suffered a traumatic tip amputation. He was brought to the ED and hand surgery was consulted.  Past Medical History:  Diagnosis Date   Carotid stenosis    10/2008: R 0-39% L 40-59%     07/2009: L ICA < 50%  L ECA high-grade  R mild plaque   Coronary artery disease    cath may 2008; stable   Diabetes mellitus type 2, diet-controlled (HCC)    Hearing loss    History of colonic polyps    History of prostate cancer    Hyperlipidemia    Hypertension    Memory change    Osteoarthritis    PVD (peripheral vascular disease) (HCC)    Tubular adenoma of colon    history of     Past Surgical History:  Procedure Laterality Date   CARDIOVERSION N/A 01/03/2019   Procedure: CARDIOVERSION;  Surgeon: Sanda Klein, MD;  Location: [facility];  Service: Cardiovascular;  Laterality: N/A;   CORONARY ARTERY BYPASS GRAFT  2000   INSERTION PROSTATE RADIATION SEED  2008    Family History  Problem Relation Age of Onset   Congestive Heart Failure Father    Heart disease Father    Breast cancer Sister    Stomach cancer Cousin    Diabetes Cousin    Diabetes Other    Heart disease Other    Prostate cancer Other    Heart disease Other    Colon cancer Neg Hx     Social History:  reports that he has quit smoking. He has never used smokeless tobacco. He reports that he does not drink alcohol and does not use drugs.  Allergies:  Allergies  Allergen Reactions   Beta Adrenergic Blockers Other (See Comments)    bradycardia    Medications: I have reviewed the patient's current medications.  No results found for this or any previous visit (from the past 48 hour(s)).  DG Pelvis Portable  Result  Date: 03/29/2021 CLINICAL DATA:  [place]. EXAM: PORTABLE PELVIS 1-2 VIEWS COMPARISON:  None. FINDINGS: Both hips are normally located. No acute hip fracture. The pubic symphysis and SI joints are intact. No pelvic fractures or bone lesions. Brachytherapy seeds are noted in the prostate gland. Vascular calcifications. IMPRESSION: No acute bony findings. Electronically Signed   By: Marijo Sanes M.D.   On: 04/01/2021 12:30   DG Chest Port 1 View  Result Date: 04/08/2021 CLINICAL DATA:  [place] today. EXAM: PORTABLE CHEST 1 VIEW COMPARISON:  07/21/2018 FINDINGS: The heart is within normal limits in size given the AP projection and portable technique. The mediastinal and hilar contours are within normal limits. Stable surgical changes from bypass surgery. Significant vascular congestion along with interstitial edema and bilateral pleural effusions and bibasilar atelectasis suggesting CHF. No pneumothorax. The bony thorax is intact. IMPRESSION: CHF with bilateral pleural effusions and bibasilar atelectasis. Electronically Signed   By: Marijo Sanes M.D.   On: 04/08/2021 12:28   DG Hand Complete Left  Result Date: 04/08/2021 CLINICAL DATA:  [place].  Left hand laceration. EXAM: LEFT HAND - COMPLETE 3+ VIEW COMPARISON:  None FINDINGS: Soft tissue abnormality involving the distal aspect of the ring finger with an overlying  bandage in place. The DIP joint is fused and there is chronic appearing deformity of distal phalanx. No acute fractures are identified. No radiopaque foreign body. Moderate degenerative changes involving the hand and wrist with chondrocalcinosis noted. IMPRESSION: 1. No acute bony findings or radiopaque foreign body. 2. Chronic appearing fusion of the DIP joint of the ring finger. 3. Moderate degenerative changes and chondrocalcinosis. Electronically Signed   By: Marijo Sanes M.D.   On: 04/16/2021 12:32    Review of Systems  HENT:  Negative for ear discharge, ear pain, hearing loss and tinnitus.    Eyes:  Negative for photophobia and pain.  Respiratory:  Negative for cough and shortness of breath.   Cardiovascular:  Negative for chest pain.  Gastrointestinal:  Negative for abdominal pain, nausea and vomiting.  Genitourinary:  Negative for dysuria, flank pain, frequency and urgency.  Musculoskeletal:  Positive for arthralgias (Left hand). Negative for back pain, myalgias and neck pain.  Neurological:  Negative for dizziness and headaches.  Hematological:  Does not bruise/bleed easily.  Psychiatric/Behavioral:  The patient is not nervous/anxious.   Blood pressure (!) 157/85, pulse (!) 54, temperature 97.7 F (36.5 C), temperature source Oral, resp. rate 13, height 5\' 10"  (1.778 m), weight 79.8 kg, SpO2 93 %. Physical Exam Constitutional:      General: He is not in acute distress.    Appearance: He is well-developed. He is not diaphoretic.  HENT:     Head: Normocephalic and atraumatic.  Eyes:     General: No scleral icterus.       Right eye: No discharge.        Left eye: No discharge.     Conjunctiva/sclera: Conjunctivae normal.  Cardiovascular:     Rate and Rhythm: Normal rate and regular rhythm.  Pulmonary:     Effort: Pulmonary effort is normal. No respiratory distress.  Musculoskeletal:     Cervical back: Normal range of motion.     Comments: Left shoulder, elbow, wrist, digits- Traumatic amputation ring finger tip, mod TTP, no instability, no blocks to motion  Sens  Ax/R/M/U intact  Mot   Ax/ R/ PIN/ M/ AIN/ U intact  Rad 2+  Skin:    General: Skin is warm and dry.  Neurological:     Mental Status: He is alert.  Psychiatric:        Mood and Affect: Mood normal.        Behavior: Behavior normal.    Assessment/Plan: Left ring finger amputation -- Plan revision by Dr. Fredna Dow this afternoon. Anticipate discharge after surgery.    [name], PA-C Orthopedic Surgery [phone] 04/15/2021, 1:06 PM   Addendum (04/17/2021): Patient seen and examined.   Agree with above. H: per patient he fell earlier today injuring left ring finger.  Unsure what he caught it on, but sustained amputation of tip of finger.  Also spoke with patient's daughter over the phone who witnessed the fall.  She is also unsure of what he caught his finger on.  She reports previous injury to the digits of that hand including the ring finger. Exam:  amputation of tip of left ring finger through nail A/P: left ring finger amputation.  Recommend revision amputation in OR.  Risks, benefits and alternatives of surgery were discussed including risks of blood loss, infection, damage to nerves/vessels/tendons/ligament/bone, failure of surgery, need for additional surgery, complication with wound healing, stiffness.  He voiced understanding of these risks and elected to proceed.  Verbal consent obtained as he is  unable to sign his name.  Also obtained telephone consent from his daughter who makes medical decisions for both of her parents.

## 2021-04-09 NOTE — ED Notes (Signed)
Pt attempted to remove IV at this time. Redirected by staff multiple times. Will continue to monitor.

## 2021-04-09 NOTE — Transfer of Care (Signed)
Immediate Anesthesia Transfer of Care Note  Patient: Allen Anderson  Procedure(s) Performed: REVISION AMPUTATION LEFT RING FINGER (Left)  Patient Location: PACU  Anesthesia Type:General  Level of Consciousness: drowsy and patient cooperative  Airway & Oxygen Therapy: Patient Spontanous Breathing  Post-op Assessment: Report given to RN and Post -op Vital signs reviewed and stable  Post vital signs: Reviewed and stable  Last Vitals:  Vitals Value Taken Time  BP 158/72 04/19/2021 1851  Temp 36.1 C 04/16/2021 1851  Pulse 44 04/22/2021 1856  Resp 11 04/13/2021 1856  SpO2 98 % 04/20/2021 1856  Vitals shown include unvalidated device data.  Last Pain:  Vitals:   04/22/2021 1639  TempSrc: Oral  PainSc:          Complications: No notable events documented.

## 2021-04-09 NOTE — ED Provider Notes (Signed)
[facility] EMERGENCY DEPARTMENT Provider Note   CSN: [id number] Arrival date & time: 04/05/2021  1153     History Chief Complaint  Patient presents with   Allen Anderson is a 87 y.o. male.  87 year old male with prior medical history as detailed below presents after fall.  Patient did strike his head.  He also has an distal fingertip amputation on the left hand ring finger.  No other injuries noted.  Patient is otherwise at his normal mental status.  The history is provided by the patient.  Fall This is a new problem. The current episode started less than 1 hour ago. The problem occurs constantly. The problem has not changed since onset.Pertinent negatives include no chest pain and no abdominal pain.      Past Medical History:  Diagnosis Date   Carotid stenosis    10/2008: R 0-39% L 40-59%     07/2009: L ICA < 50%  L ECA high-grade  R mild plaque   Coronary artery disease    cath may 2008; stable   Diabetes mellitus type 2, diet-controlled (HCC)    Hearing loss    History of colonic polyps    History of prostate cancer    Hyperlipidemia    Hypertension    Memory change    Osteoarthritis    PVD (peripheral vascular disease) ([place])    Tubular adenoma of colon    history of     Patient Active Problem List   Diagnosis Date Noted   PAF (paroxysmal atrial fibrillation) ([place]) 01/26/2019   Chronic anticoagulation 01/26/2019   Spinal stenosis 01/26/2019   Chronic renal disease, stage III ([place]) 01/26/2019   Hx of CABG 10/15/2010   TRIGGER FINGER DEFORMITY 10/13/2007   CONSTIPATION 10/05/2007   Non-insulin dependent type 2 diabetes mellitus ([place]) 04/08/2007   Dyslipidemia, goal LDL below 70 02/04/2007   Essential hypertension 02/04/2007   Carotid artery disease ([place]) 02/04/2007   OSTEOARTHRITIS 02/04/2007   PROSTATE CANCER, HX OF 02/04/2007   COLONIC POLYPS, HX OF 02/04/2007   HYPERCHOLESTEROLEMIA 07/23/2006   HEARING LOSS NOS OR DEAFNESS  07/23/2006    Past Surgical History:  Procedure Laterality Date   CARDIOVERSION N/A 01/03/2019   Procedure: CARDIOVERSION;  Surgeon: Sanda Klein, MD;  Location: [facility] ENDOSCOPY;  Service: Cardiovascular;  Laterality: N/A;   CORONARY ARTERY BYPASS GRAFT  2000   INSERTION PROSTATE RADIATION SEED  2008       Family History  Problem Relation Age of Onset   Congestive Heart Failure Father    Heart disease Father    Breast cancer Sister    Stomach cancer Cousin    Diabetes Cousin    Diabetes Other    Heart disease Other    Prostate cancer Other    Heart disease Other    Colon cancer Neg Hx     Social History   Tobacco Use   Smoking status: Former   Smokeless tobacco: Never  Substance Use Topics   Alcohol use: No   Drug use: No    Home Medications Prior to Admission medications   Medication Sig Start Date End Date Taking? Authorizing Provider  atorvastatin (LIPITOR) 20 MG tablet TAKE 1 TABLET DAILY AT 6 P.M. Patient taking differently: Take 20 mg by mouth at bedtime. 07/02/20  Yes Lelon Perla, MD  Cholecalciferol (VITAMIN D3) 50 MCG (2000 UT) TABS Take 2,000 Units by mouth daily.   Yes [provider]  ELIQUIS 5 MG  TABS tablet Take 5 mg by mouth 2 (two) times daily. 03/25/21  Yes [provider]  furosemide (LASIX) 20 MG tablet Take 2 tablets (40 mg total) by mouth daily. 12/03/20 06/01/21 Yes Crenshaw, Denice Bors, MD  Ginkgo Biloba 120 MG CAPS Take 120 mg by mouth at bedtime.   Yes [provider]  Glucos-MSM-C-Mn-Ginger-Willow (GLUCOSAMINE MSM COMPLEX PO) Take 1 tablet by mouth daily.   Yes [provider]  hydrOXYzine (VISTARIL) 25 MG capsule Take 25 mg by mouth at bedtime. 04/05/21  Yes [provider]  losartan (COZAAR) 50 MG tablet Take 50 mg by mouth 2 (two) times daily. 04/04/21  Yes [provider]  memantine (NAMENDA) 5 MG tablet Take 5 mg by mouth at bedtime. 03/27/21  Yes [provider]  Multiple  Vitamin (MULTIVITAMIN) tablet Take 1 tablet by mouth daily.   Yes [provider]  vitamin B-12 (CYANOCOBALAMIN) 500 MCG tablet Take 500 mcg by mouth daily.   Yes [provider]  zinc gluconate 50 MG tablet Take 50 mg by mouth daily.   Yes [provider]    Allergies    Beta adrenergic blockers  Review of Systems   Review of Systems  Cardiovascular:  Negative for chest pain.  Gastrointestinal:  Negative for abdominal pain.  All other systems reviewed and are negative.  Physical Exam Updated Vital Signs BP (!) 144/75   Pulse (!) 54   Temp 97.7 F (36.5 C) (Oral)   Resp 17   Ht 5\' 10"  (1.778 m)   Wt 79.8 kg   SpO2 94%   BMI 25.24 kg/m   Physical Exam Vitals and nursing note reviewed.  Constitutional:      General: He is not in acute distress.    Appearance: Normal appearance. He is well-developed.  HENT:     Head: Normocephalic.     Comments: Superficial abrasion noted to the right posterior occiput. Eyes:     Conjunctiva/sclera: Conjunctivae normal.     Pupils: Pupils are equal, round, and reactive to light.  Cardiovascular:     Rate and Rhythm: Normal rate and regular rhythm.     Heart sounds: Normal heart sounds.  Pulmonary:     Effort: Pulmonary effort is normal. No respiratory distress.     Breath sounds: Normal breath sounds.  Abdominal:     General: There is no distension.     Palpations: Abdomen is soft.     Tenderness: There is no abdominal tenderness.  Musculoskeletal:        General: Tenderness present. No deformity. Normal range of motion.     Cervical back: Normal range of motion and neck supple.     Comments: Distal fingertip amputation on the left hand ring finger.  Skin:    General: Skin is warm and dry.  Neurological:     General: No focal deficit present.     Mental Status: He is alert and oriented to person, place, and time.    ED Results / Procedures / Treatments   Labs (all labs ordered are listed, but only  abnormal results are displayed) Labs Reviewed  SURGICAL PCR SCREEN  RESP PANEL BY RT-PCR (FLU A&B, COVID) ARPGX2  BASIC METABOLIC PANEL  CBC WITH DIFFERENTIAL/PLATELET    EKG None  Radiology CT Head Wo Contrast  Result Date: 04/07/2021 CLINICAL DATA:  Trauma EXAM: CT HEAD WITHOUT CONTRAST TECHNIQUE: Contiguous axial images were obtained from the base of the skull through the vertex without intravenous contrast. COMPARISON:  10/08/2006 FINDINGS: Brain: No acute intracranial findings are seen. Cortical sulci are prominent. There is decreased density in the periventricular white matter. Vascular: There are scattered arterial calcifications. Skull: Unremarkable. Sinuses/Orbits: Unremarkable. Other: No significant interval changes are noted. IMPRESSION: No acute intracranial findings are seen in noncontrast CT brain. Atrophy. Small-vessel disease. Electronically Signed   By: Elmer Picker M.D.   On: 04/06/2021 13:18   CT Cervical Spine Wo Contrast  Result Date: 03/27/2021 CLINICAL DATA:  Neck trauma.  Intoxicated.  Initial encounter. EXAM: CT CERVICAL SPINE WITHOUT CONTRAST TECHNIQUE: Multidetector CT imaging of the cervical spine was performed without intravenous contrast. Multiplanar CT image reconstructions were also generated. COMPARISON:  None. FINDINGS: Alignment: Normal. Skull base and vertebrae: No acute fracture. No primary bone lesion or focal pathologic process. Soft tissues and spinal canal: No prevertebral fluid or swelling. No visible canal hematoma. Disc levels: Mild-to-moderate degenerative disc disease is seen from levels of C3-C7. Mild to moderate facet DJD is seen on the right side from levels of C C4-T1. Upper chest: Layering pleural effusion is seen in the left lung apex. Other: None. IMPRESSION: No evidence of cervical spine fracture or subluxation. Degenerative cervical spondylosis, as described above. Layering left pleural effusion noted, is of uncertain etiology and  clinical significance. Correlation with chest radiograph recommended. Electronically Signed   By: Marlaine Hind M.D.   On: 03/30/2021 13:20   DG Pelvis Portable  Result Date: 04/06/2021 CLINICAL DATA:  [place]. EXAM: PORTABLE PELVIS 1-2 VIEWS COMPARISON:  None. FINDINGS: Both hips are normally located. No acute hip fracture. The pubic symphysis and SI joints are intact. No pelvic fractures or bone lesions. Brachytherapy seeds are noted in the prostate gland. Vascular calcifications. IMPRESSION: No acute bony findings. Electronically Signed   By: Marijo Sanes M.D.   On: 04/12/2021 12:30   DG Chest Port 1 View  Result Date: 04/15/2021 CLINICAL DATA:  [place] today. EXAM: PORTABLE CHEST 1 VIEW COMPARISON:  07/21/2018 FINDINGS: The heart is within normal limits in size given the AP projection and portable technique. The mediastinal and hilar contours are within normal limits. Stable surgical changes from bypass surgery. Significant vascular congestion along with interstitial edema and bilateral pleural effusions and bibasilar atelectasis suggesting CHF. No pneumothorax. The bony thorax is intact. IMPRESSION: CHF with bilateral pleural effusions and bibasilar atelectasis. Electronically Signed   By: Marijo Sanes M.D.   On: 03/30/2021 12:28   DG Hand Complete Left  Result Date: 04/14/2021 CLINICAL DATA:  [place].  Left hand laceration. EXAM: LEFT HAND - COMPLETE 3+ VIEW COMPARISON:  None FINDINGS: Soft tissue abnormality involving the distal aspect of the ring finger with an overlying bandage in place. The DIP joint is fused and there is chronic appearing deformity of distal phalanx. No acute fractures are identified. No radiopaque foreign body. Moderate degenerative changes involving the hand and wrist with chondrocalcinosis noted. IMPRESSION: 1. No acute bony findings or radiopaque foreign body. 2. Chronic appearing fusion of the DIP joint of the ring finger. 3. Moderate degenerative changes and  chondrocalcinosis. Electronically Signed   By: Marijo Sanes M.D.   On: 04/15/2021 12:32    Procedures Procedures   Medications Ordered in ED Medications  Tdap (BOOSTRIX) injection 0.5 mL (0.5 mLs Intramuscular Given 04/08/2021 1218)    ED Course  I have reviewed the triage vital signs and the nursing notes.  Pertinent labs & imaging results that were available during my care of the patient were reviewed by me and considered in my  medical decision making (see chart for details).    MDM Rules/Calculators/A&P                           MDM  MSE complete  LIVAN HIRES was evaluated in Emergency Department on 04/06/2021 for the symptoms described in the history of present illness. He was evaluated in the context of the global COVID-19 pandemic, which necessitated consideration that the patient might be at risk for infection with the SARS-CoV-2 virus that causes COVID-19. Institutional protocols and algorithms that pertain to the evaluation of patients at risk for COVID-19 are in a state of rapid change based on information released by regulatory bodies including the CDC and federal and state organizations. These policies and algorithms were followed during the patient's care in the ED.  Patient is presenting after fall from standing.  Patient did strike his head.  Patient also has a distal fingertip amputation on the left ring finger.  CT imaging is without evidence of significant acute pathology.  Left ring finger will require OR revision.  Patient to have revision with Dr. Fredna Dow later this afternoon.  Preoperative screening labs ordered and pending at time of this note.   Final Clinical Impression(s) / ED Diagnoses Final diagnoses:  Fingertip amputation, initial encounter  Fall, initial encounter    Rx / DC Orders ED Discharge Orders     None        Valarie Merino, MD 04/22/2021 1454

## 2021-04-09 NOTE — Op Note (Signed)
NAME: Allen Anderson MEDICAL RECORD NO: [id number] DATE OF BIRTH: 1929-04-21 FACILITY: [facility] LOCATION: [facility] OR PHYSICIAN: Tennis Must, MD   OPERATIVE REPORT   DATE OF PROCEDURE: 04/23/2021    PREOPERATIVE DIAGNOSIS: Left ring finger amputation   POSTOPERATIVE DIAGNOSIS: Left ring finger amputation   PROCEDURE: Left ring finger revision amputation   SURGEON:  Leanora Cover, M.D.   ASSISTANT: none   ANESTHESIA:  Local with sedation   INTRAVENOUS FLUIDS:  Per anesthesia flow sheet.   ESTIMATED BLOOD LOSS:  Minimal.   COMPLICATIONS:  None.   SPECIMENS:  none   TOURNIQUET TIME:   Left ring finger: Penrose drain approximately 20 minutes   DISPOSITION:  Stable to PACU.   INDICATIONS: 87 year old male states he fell down earlier today injuring his left ring finger.  Was seen at the emergency department where he was found to have amputation of the distal aspect of the left ring finger.  Recommended revision amputation in the operating room.  Risks, benefits and alternatives of surgery were discussed including the risks of blood loss, infection, damage to nerves, vessels, tendons, ligaments, bone for surgery, need for additional surgery, complications with wound healing, continued pain, stiffness.  He voiced understanding of these risks and elected to proceed.  OPERATIVE COURSE:  After being identified preoperatively by myself,  the patient and I agreed on the procedure and site of the procedure.  The surgical site was marked.  Surgical consent had been signed. He was given IV antibiotics as preoperative antibiotic prophylaxis. He was transferred to the operating room and placed on the operating table in supine position with the Left upper extremity on an arm board.  Sedation was induced by the anesthesiologist.  Left upper extremity was prepped and draped in normal sterile orthopedic fashion.  A surgical pause was performed between the surgeons, anesthesia, and operating room staff and  all were in agreement as to the patient, procedure, and site of procedure.  A digital block was performed with 9 cc of quarter percent plain Marcaine to aid in postoperative analgesia and anesthesia during the case.  Penrose drain was used as a tourniquet after exsanguination of the limb with a 4 x 4 gauze.  The wound was explored.  There is no gross contamination.  The nail was removed with a freer elevator.  The bone was amputated through the tuft in an oblique fashion.  This was shortened with the rongeurs to be transverse.  There were few small bony fragments which were sharply removed with a knife.  The wound was then copiously irrigated with sterile saline.  6-0 chromic suture was used to reapproximate the more subcutaneous tissues to the dorsal nailbed.  Good tension-free apposition was obtained.  There were dogears at either corner and these were removed.  The wound was closed with the 6-0 chromic in a interrupted fashion.  Good overall contour to the finger was obtained.  A piece of Xeroform was placed in nail fold and the wound dressed with Xeroform 4 x 4 and wrapped with a Coban dressing lightly.  An AlumaFoam splint was placed and wrapped lightly with Coban dressing.  There is a skin tear in the palm which was also irrigated with sterile saline.  The skin was laid back over the wound and the wound dressed with sterile Xeroform and 4 x 4 and wrapped with a Coban dressing lightly.  The Penrose drain was removed from the finger along with a 4 x 4 gauze.  Fingertips were  pink with brisk capillary refill at completion of the case.  The operative  drapes were broken down.  The patient was awoken from anesthesia safely.  He was transferred back to the stretcher and taken to PACU in stable condition.  I will see him back in the office in 1 week for postoperative followup.  I will give him a prescription for Tramadol 50 mg 1 tab PO q6 hours prn pain, dispense # 15.   Leanora Cover, MD Electronically signed,  04/15/2021

## 2021-04-09 NOTE — H&P (Signed)
History and Physical    Allen Anderson NIO:[id number] DOB: [birth year] DOA: 04/17/2021  PCP: Garnetta Buddy I, NP  Patient coming from: SNF  I have personally briefly reviewed patient's old medical records in [place]  Chief Complaint: Fall, finger injury  HPI: Allen Anderson is a 87 y.o. male with medical history significant of moderate dementia, PAF with SVR, HTN, HLD, CAD s/p CABG.  Pt tripped and fell at SNF today, fall witnessed by family members.  Described as pt's feet got tangled and he fell injuring L ring finger (amputating tip of it).  Did hit head in fall.  Had been having syncope / pre-syncope episodes previously (being worked up by cards, ? If bradycardia is causing, actually planned to have loop recorder installed shortly).  However, no syncope or LOC with fall today per family who witnessed event, just got legs tangled.  ED Course: CT head neg.  Pt underwent L ring finger amputation revision by Dr. Fredna Dow.  Pt being admitted to medicine overnight due to no way to get him back to SNF.  Pt also seems to be altered much more than baseline post-op.  Does indicate he is having pain in ring finger.  Is MAE with good strength.   Review of Systems: Unable to perform due to AMS  Past Medical History:  Diagnosis Date   Carotid stenosis    10/2008: R 0-39% L 40-59%     07/2009: L ICA < 50%  L ECA high-grade  R mild plaque   Coronary artery disease    cath may 2008; stable   Diabetes mellitus type 2, diet-controlled (HCC)    Hearing loss    History of colonic polyps    History of prostate cancer    Hyperlipidemia    Hypertension    Memory change    Osteoarthritis    PVD (peripheral vascular disease) (HCC)    Tubular adenoma of colon    history of     Past Surgical History:  Procedure Laterality Date   CARDIOVERSION N/A 01/03/2019   Procedure: CARDIOVERSION;  Surgeon: Sanda Klein, MD;  Location: [facility];  Service: Cardiovascular;  Laterality: N/A;    CORONARY ARTERY BYPASS GRAFT  2000   [place]  2008     reports that he has quit smoking. He has never used smokeless tobacco. He reports that he does not drink alcohol and does not use drugs.  Allergies  Allergen Reactions   Beta Adrenergic Blockers Other (See Comments)    bradycardia    Family History  Problem Relation Age of Onset   Congestive Heart Failure Father    Heart disease Father    Breast cancer Sister    Stomach cancer Cousin    Diabetes Cousin    Diabetes Other    Heart disease Other    Prostate cancer Other    Heart disease Other    Colon cancer Neg Hx      Prior to Admission medications   Medication Sig Start Date End Date Taking? Authorizing Provider  atorvastatin (LIPITOR) 20 MG tablet TAKE 1 TABLET DAILY AT 6 P.M. Patient taking differently: Take 20 mg by mouth at bedtime. 07/02/20  Yes Lelon Perla, MD  Cholecalciferol (VITAMIN D3) 50 MCG (2000 UT) TABS Take 2,000 Units by mouth daily.   Yes [provider]  ELIQUIS 5 MG TABS tablet Take 5 mg by mouth 2 (two) times daily. 03/25/21  Yes [provider]  furosemide (LASIX)  20 MG tablet Take 2 tablets (40 mg total) by mouth daily. 12/03/20 06/01/21 Yes Crenshaw, Denice Bors, MD  Ginkgo Biloba 120 MG CAPS Take 120 mg by mouth at bedtime.   Yes [provider]  Glucos-MSM-C-Mn-Ginger-Willow (GLUCOSAMINE MSM COMPLEX PO) Take 1 tablet by mouth daily.   Yes [provider]  hydrOXYzine (VISTARIL) 25 MG capsule Take 25 mg by mouth at bedtime. 04/05/21  Yes [provider]  losartan (COZAAR) 50 MG tablet Take 50 mg by mouth 2 (two) times daily. 04/04/21  Yes [provider]  memantine (NAMENDA) 5 MG tablet Take 5 mg by mouth at bedtime. 03/27/21  Yes [provider]  Multiple Vitamin (MULTIVITAMIN) tablet Take 1 tablet by mouth daily.   Yes [provider]  traMADol (ULTRAM) 50 MG tablet 1 tab PO q6 hours prn pain 03/31/2021   Yes Leanora Cover, MD  vitamin B-12 (CYANOCOBALAMIN) 500 MCG tablet Take 500 mcg by mouth daily.   Yes [provider]  zinc gluconate 50 MG tablet Take 50 mg by mouth daily.   Yes [provider]    Physical Exam: Vitals:   04/15/2021 2021 03/27/2021 2035 04/06/2021 2042 04/18/2021 2050  BP: (!) 171/85 (!) 191/77 (!) 197/99 (!) 191/77  Pulse: (!) 54   66  Resp: 12   (!) 21  Temp:      TempSrc:      SpO2: 92% 99%  96%  Weight:      Height:        Constitutional: Uncomfortable, confused Eyes: PERRL, lids and conjunctivae normal ENMT: Mucous membranes are moist. Posterior pharynx clear of any exudate or lesions.Normal dentition.  Neck: normal, supple, no masses, no thyromegaly Respiratory: clear to auscultation bilaterally, no wheezing, no crackles. Normal respiratory effort. No accessory muscle use.  Cardiovascular: IRR, IRR Abdomen: no tenderness, no masses palpated. No hepatosplenomegaly. Bowel sounds positive.  Musculoskeletal: L ring finger dressing C/D/I Skin: no rashes, lesions, ulcers. No induration Neurologic: MAE with 5/5 strength in all 4 extremities.  Answering simple yes/no questions. Psychiatric: Confused, groaning in pain   Labs on Admission: I have personally reviewed following labs and imaging studies  CBC: Recent Labs  Lab 04/07/2021 1206  WBC 6.3  NEUTROABS 4.3  HGB 11.7*  HCT 37.7*  MCV 93.8  PLT 810*   Basic Metabolic Panel: Recent Labs  Lab 03/30/2021 1206  NA 137  K 4.1  CL 100  CO2 26  GLUCOSE 106*  BUN 40*  CREATININE 1.43*  CALCIUM 9.4   GFR: Estimated Creatinine Clearance: 35.5 mL/min (A) (by C-G formula based on SCr of 1.43 mg/dL (H)). Liver Function Tests: No results for input(s): AST, ALT, ALKPHOS, BILITOT, PROT, ALBUMIN in the last 168 hours. No results for input(s): LIPASE, AMYLASE in the last 168 hours. No results for input(s): AMMONIA in the last 168 hours. Coagulation Profile: No results for input(s): INR, PROTIME  in the last 168 hours. Cardiac Enzymes: No results for input(s): CKTOTAL, CKMB, CKMBINDEX, TROPONINI in the last 168 hours. BNP (last 3 results) No results for input(s): PROBNP in the last 8760 hours. HbA1C: No results for input(s): HGBA1C in the last 72 hours. CBG: Recent Labs  Lab 04/08/2021 1854  GLUCAP 113*   Lipid Profile: No results for input(s): CHOL, HDL, LDLCALC, TRIG, CHOLHDL, LDLDIRECT in the last 72 hours. Thyroid Function Tests: No results for input(s): TSH, T4TOTAL, FREET4, T3FREE, THYROIDAB in the last 72 hours. Anemia Panel: No results for input(s): VITAMINB12, FOLATE, FERRITIN, TIBC,  IRON, RETICCTPCT in the last 72 hours. Urine analysis: No results found for: COLORURINE, APPEARANCEUR, LABSPEC, PHURINE, GLUCOSEU, HGBUR, BILIRUBINUR, KETONESUR, PROTEINUR, UROBILINOGEN, NITRITE, LEUKOCYTESUR  Radiological Exams on Admission: CT Head Wo Contrast  Result Date: 03/31/2021 CLINICAL DATA:  Trauma EXAM: CT HEAD WITHOUT CONTRAST TECHNIQUE: Contiguous axial images were obtained from the base of the skull through the vertex without intravenous contrast. COMPARISON:  10/08/2006 FINDINGS: Brain: No acute intracranial findings are seen. Cortical sulci are prominent. There is decreased density in the periventricular white matter. Vascular: There are scattered arterial calcifications. Skull: Unremarkable. Sinuses/Orbits: Unremarkable. Other: No significant interval changes are noted. IMPRESSION: No acute intracranial findings are seen in noncontrast CT brain. Atrophy. Small-vessel disease. Electronically Signed   By: Elmer Picker M.D.   On: 04/01/2021 13:18   CT Cervical Spine Wo Contrast  Result Date: 04/14/2021 CLINICAL DATA:  Neck trauma.  Intoxicated.  Initial encounter. EXAM: CT CERVICAL SPINE WITHOUT CONTRAST TECHNIQUE: Multidetector CT imaging of the cervical spine was performed without intravenous contrast. Multiplanar CT image reconstructions were also generated.  COMPARISON:  None. FINDINGS: Alignment: Normal. Skull base and vertebrae: No acute fracture. No primary bone lesion or focal pathologic process. Soft tissues and spinal canal: No prevertebral fluid or swelling. No visible canal hematoma. Disc levels: Mild-to-moderate degenerative disc disease is seen from levels of C3-C7. Mild to moderate facet DJD is seen on the right side from levels of C C4-T1. Upper chest: Layering pleural effusion is seen in the left lung apex. Other: None. IMPRESSION: No evidence of cervical spine fracture or subluxation. Degenerative cervical spondylosis, as described above. Layering left pleural effusion noted, is of uncertain etiology and clinical significance. Correlation with chest radiograph recommended. Electronically Signed   By: Marlaine Hind M.D.   On: 03/27/2021 13:20   DG Pelvis Portable  Result Date: 04/21/2021 CLINICAL DATA:  [place]. EXAM: PORTABLE PELVIS 1-2 VIEWS COMPARISON:  None. FINDINGS: Both hips are normally located. No acute hip fracture. The pubic symphysis and SI joints are intact. No pelvic fractures or bone lesions. Brachytherapy seeds are noted in the prostate gland. Vascular calcifications. IMPRESSION: No acute bony findings. Electronically Signed   By: Marijo Sanes M.D.   On: 04/14/2021 12:30   DG Chest Port 1 View  Result Date: 04/23/2021 CLINICAL DATA:  [place] today. EXAM: PORTABLE CHEST 1 VIEW COMPARISON:  07/21/2018 FINDINGS: The heart is within normal limits in size given the AP projection and portable technique. The mediastinal and hilar contours are within normal limits. Stable surgical changes from bypass surgery. Significant vascular congestion along with interstitial edema and bilateral pleural effusions and bibasilar atelectasis suggesting CHF. No pneumothorax. The bony thorax is intact. IMPRESSION: CHF with bilateral pleural effusions and bibasilar atelectasis. Electronically Signed   By: Marijo Sanes M.D.   On: 04/02/2021 12:28   DG Hand  Complete Left  Result Date: 04/15/2021 CLINICAL DATA:  [place].  Left hand laceration. EXAM: LEFT HAND - COMPLETE 3+ VIEW COMPARISON:  None FINDINGS: Soft tissue abnormality involving the distal aspect of the ring finger with an overlying bandage in place. The DIP joint is fused and there is chronic appearing deformity of distal phalanx. No acute fractures are identified. No radiopaque foreign body. Moderate degenerative changes involving the hand and wrist with chondrocalcinosis noted. IMPRESSION: 1. No acute bony findings or radiopaque foreign body. 2. Chronic appearing fusion of the DIP joint of the ring finger. 3. Moderate degenerative changes and chondrocalcinosis. Electronically Signed   By: Ricky Stabs.D.  On: 04/07/2021 12:32    EKG: Independently reviewed.  Assessment/Plan Principal Problem:   Traumatic amputation of finger Active Problems:   Essential hypertension   Hx of CABG   Chronic anticoagulation   Chronic renal disease, stage III (HCC)   Atrial fibrillation with slow ventricular response (HCC)   Moderate dementia   Acute metabolic encephalopathy    Traumatic amputation of finger - POD #0 of revision of amputation Pain control Follow up 1 week with Dr. Fredna Dow in office Acute metabolic encephalopathy - Likely delirium secondary to #1 and slow to wake up after anesthesia Monitor for now LR at 75 overnight CT head in ED neg A.Fib SVR - Tele monitor Holding eliquis for tonight at least given post-op bleeding Repeat CBC in AM HTN - PRN hydralazine Resume home BP meds when able to take POs CKD 3 - chronic and stable Mod dementia - Chronic Acute encephalopathy today as noted above  DVT prophylaxis: SCDs Code Status: Full - for now per daughter Family Communication: Spoke with daughter on phone Disposition Plan: Back to SNF, possibly in AM Consults called: Dr. Fredna Dow Admission status: Place in obs     Jadasia Haws, [place] [facility]  How to  contact the [facility] Attending or Consulting provider [place] or covering provider during after hours [place], for this patient?  Check the care team in [facility] and look for a) attending/consulting [facility] provider listed and b) the [facility] team listed Log into [email or web address]  Amion Physician Scheduling and messaging for groups and whole hospitals  On call and physician scheduling software for group practices, residents, hospitalists and other medical providers for call, clinic, rotation and shift schedules. OnCall Enterprise is a hospital-wide system for scheduling doctors and paging doctors on call. EasyPlot is for scientific plotting and data analysis.  [email or web address]  and use [facility] universal password to access. If you do not have the password, please contact the hospital operator.  Locate the [facility] provider you are looking for under [facility] and page to a number that you can be directly reached. If you still have difficulty reaching the provider, please page the [facility] (Director on Call) for the Hospitalists listed on amion for assistance.  04/02/2021, 9:17 PM

## 2021-04-09 NOTE — Progress Notes (Signed)
Orthopedic Tech Progress Note Patient Details:  Allen Anderson Oct 20, 1928 [id number]  Level 2 trauma   Patient ID: Allen Anderson, male   DOB: January 23, 1929, 87 y.o.   MRN: [id number]  Allen Anderson 04/15/2021, 12:10 PM

## 2021-04-09 NOTE — ED Notes (Signed)
Report given to [facility]. Pt is going to [place] [facility].

## 2021-04-09 NOTE — Anesthesia Postprocedure Evaluation (Signed)
Anesthesia Post Note  Patient: Allen Anderson  Procedure(s) Performed: REVISION AMPUTATION LEFT RING FINGER (Left)     Patient location during evaluation: PACU Anesthesia Type: MAC Level of consciousness: awake Pain management: pain level controlled Vital Signs Assessment: post-procedure vital signs reviewed and stable Respiratory status: spontaneous breathing Cardiovascular status: stable Postop Assessment: no apparent nausea or vomiting Anesthetic complications: no   No notable events documented.  Last Vitals:  Vitals:   03/27/2021 2006 03/26/2021 2021  BP: (!) 171/85 (!) 171/85  Pulse: (!) 54 (!) 54  Resp: 16 12  Temp:    SpO2: 92% 92%    Last Pain:  Vitals:   04/15/2021 1639  TempSrc: Oral  PainSc:                  Lenny Fiumara

## 2021-04-09 NOTE — Anesthesia Preprocedure Evaluation (Addendum)
Anesthesia Evaluation  Patient identified by MRN, date of birth, ID band Patient awake    Reviewed: Allergy & Precautions, NPO status , Patient's Chart, lab work & pertinent test results  History of Anesthesia Complications Negative for: history of anesthetic complications  Airway Mallampati: III  TM Distance: >3 FB Neck ROM: Full    Dental  (+) Dental Advisory Given   Pulmonary former smoker,   CXR today - IMPRESSION: CHF with bilateral pleural effusions and bibasilar atelectasis     + decreased breath sounds      Cardiovascular hypertension, Pt. on medications + CAD, + CABG and + Peripheral Vascular Disease  Normal cardiovascular exam+ dysrhythmias Atrial Fibrillation      Neuro/Psych PSYCHIATRIC DISORDERS Dementia  Hearing loss     GI/Hepatic negative GI ROS, Neg liver ROS,   Endo/Other  diabetes, Type 2  Renal/GU CRFRenal disease    Prostate cancer     Musculoskeletal  (+) Arthritis ,   Abdominal   Peds  Hematology  (+) anemia ,  Plt 148k On eliquis    Anesthesia Other Findings   Reproductive/Obstetrics                            Anesthesia Physical Anesthesia Plan  ASA: 3  Anesthesia Plan: MAC   Post-op Pain Management:    Induction:   PONV Risk Score and Plan: 1 and Propofol infusion and Treatment may vary due to age or medical condition  Airway Management Planned: Natural Airway and Simple Face Mask  Additional Equipment: None  Intra-op Plan:   Post-operative Plan:   Informed Consent: I have reviewed the patients History and Physical, chart, labs and discussed the procedure including the risks, benefits and alternatives for the proposed anesthesia with the patient or authorized representative who has indicated his/her understanding and acceptance.     Consent reviewed with POA  Plan Discussed with: CRNA and Anesthesiologist  Anesthesia Plan Comments:        Anesthesia Quick Evaluation

## 2021-04-09 NOTE — Progress Notes (Signed)
1630 Received pt from ED, alert, oriented to person, confused. I called pt's daughter Allen Anderson since pt's wife is also in SNF. She wanted to talk to the surgeon, message left for Dr Fredna Dow. Pt's NPO status was confirmed by pt's nurse at [place].

## 2021-04-10 ENCOUNTER — Encounter (HOSPITAL_COMMUNITY): Payer: Self-pay | Admitting: Orthopedic Surgery

## 2021-04-10 ENCOUNTER — Observation Stay (HOSPITAL_COMMUNITY): Payer: Medicare Other | Admitting: Anesthesiology

## 2021-04-10 ENCOUNTER — Observation Stay (HOSPITAL_COMMUNITY): Payer: Medicare Other

## 2021-04-10 DIAGNOSIS — Z4682 Encounter for fitting and adjustment of non-vascular catheter: Secondary | ICD-10-CM | POA: Diagnosis not present

## 2021-04-10 DIAGNOSIS — I469 Cardiac arrest, cause unspecified: Secondary | ICD-10-CM

## 2021-04-10 DIAGNOSIS — R918 Other nonspecific abnormal finding of lung field: Secondary | ICD-10-CM | POA: Diagnosis not present

## 2021-04-10 DIAGNOSIS — S68625A Partial traumatic transphalangeal amputation of left ring finger, initial encounter: Secondary | ICD-10-CM | POA: Diagnosis not present

## 2021-04-10 DIAGNOSIS — Z951 Presence of aortocoronary bypass graft: Secondary | ICD-10-CM | POA: Diagnosis not present

## 2021-04-10 DIAGNOSIS — S68119A Complete traumatic metacarpophalangeal amputation of unspecified finger, initial encounter: Secondary | ICD-10-CM | POA: Diagnosis not present

## 2021-04-10 LAB — POCT I-STAT 7, (LYTES, BLD GAS, ICA,H+H)
Acid-base deficit: 10 mmol/L — ABNORMAL HIGH (ref 0.0–2.0)
Bicarbonate: 14.7 mmol/L — ABNORMAL LOW (ref 20.0–28.0)
Calcium, Ion: 1.14 mmol/L — ABNORMAL LOW (ref 1.15–1.40)
HCT: 33 % — ABNORMAL LOW (ref 39.0–52.0)
Hemoglobin: 11.2 g/dL — ABNORMAL LOW (ref 13.0–17.0)
O2 Saturation: 100 %
Potassium: 4.6 mmol/L (ref 3.5–5.1)
Sodium: 136 mmol/L (ref 135–145)
TCO2: 16 mmol/L — ABNORMAL LOW (ref 22–32)
pCO2 arterial: 27.9 mmHg — ABNORMAL LOW (ref 32.0–48.0)
pH, Arterial: 7.329 — ABNORMAL LOW (ref 7.350–7.450)
pO2, Arterial: 206 mmHg — ABNORMAL HIGH (ref 83.0–108.0)

## 2021-04-10 LAB — CBC
HCT: 36.5 % — ABNORMAL LOW (ref 39.0–52.0)
Hemoglobin: 11.6 g/dL — ABNORMAL LOW (ref 13.0–17.0)
MCH: 29.3 pg (ref 26.0–34.0)
MCHC: 31.8 g/dL (ref 30.0–36.0)
MCV: 92.2 fL (ref 80.0–100.0)
Platelets: 170 10*3/uL (ref 150–400)
RBC: 3.96 MIL/uL — ABNORMAL LOW (ref 4.22–5.81)
RDW: 15.5 % (ref 11.5–15.5)
WBC: 8.6 10*3/uL (ref 4.0–10.5)
nRBC: 0 % (ref 0.0–0.2)

## 2021-04-10 LAB — BASIC METABOLIC PANEL
Anion gap: 17 — ABNORMAL HIGH (ref 5–15)
BUN: 41 mg/dL — ABNORMAL HIGH (ref 8–23)
CO2: 22 mmol/L (ref 22–32)
Calcium: 9.4 mg/dL (ref 8.9–10.3)
Chloride: 99 mmol/L (ref 98–111)
Creatinine, Ser: 1.69 mg/dL — ABNORMAL HIGH (ref 0.61–1.24)
GFR, Estimated: 38 mL/min — ABNORMAL LOW (ref >60)
Glucose, Bld: 144 mg/dL — ABNORMAL HIGH (ref 70–99)
Potassium: 4 mmol/L (ref 3.5–5.1)
Sodium: 138 mmol/L (ref 135–145)

## 2021-04-10 LAB — GLUCOSE, CAPILLARY: Glucose-Capillary: 148 mg/dL — ABNORMAL HIGH (ref 70–99)

## 2021-04-10 MED ORDER — FENTANYL CITRATE (PF) 100 MCG/2ML IJ SOLN: 25.0000 ug | INTRAMUSCULAR | Status: DC | PRN

## 2021-04-10 MED ORDER — FENTANYL CITRATE (PF) 100 MCG/2ML IJ SOLN
INTRAMUSCULAR | Status: AC
  Administered 2021-04-10: 100 ug via INTRAVENOUS
  Filled 2021-04-10: qty 2

## 2021-04-10 MED ORDER — NOREPINEPHRINE 4 MG/250ML-% IV SOLN
INTRAVENOUS | Status: AC
  Filled 2021-04-10: qty 250

## 2021-04-10 MED ORDER — DOCUSATE SODIUM 50 MG/5ML PO LIQD: 100.0000 mg | Freq: Two times a day (BID) | ORAL | Status: DC

## 2021-04-10 MED ORDER — FENTANYL CITRATE (PF) 100 MCG/2ML IJ SOLN
25.0000 ug | INTRAMUSCULAR | Status: DC | PRN
  Administered 2021-04-10: 100 ug via INTRAVENOUS
  Filled 2021-04-10: qty 2

## 2021-04-10 MED ORDER — EPINEPHRINE HCL 5 MG/250ML IV SOLN IN NS
INTRAVENOUS | Status: AC
  Filled 2021-04-10: qty 250

## 2021-04-10 MED ORDER — FENTANYL CITRATE (PF) 100 MCG/2ML IJ SOLN: 100.0000 ug | INTRAMUSCULAR | Status: AC

## 2021-04-10 MED ORDER — MORPHINE SULFATE (PF) 2 MG/ML IV SOLN
INTRAVENOUS | Status: AC
  Administered 2021-04-10: 6 mg via INTRAVENOUS
  Filled 2021-04-10: qty 2

## 2021-04-10 MED ORDER — MORPHINE SULFATE (PF) 2 MG/ML IV SOLN: 6.0000 mg | INTRAVENOUS | Status: AC

## 2021-04-10 MED ORDER — CHLORHEXIDINE GLUCONATE CLOTH 2 % EX PADS: 6.0000 | MEDICATED_PAD | Freq: Every day | CUTANEOUS | Status: DC

## 2021-04-10 MED ORDER — ATROPINE SULFATE 1 MG/10ML IJ SOSY
PREFILLED_SYRINGE | INTRAMUSCULAR | Status: AC
  Filled 2021-04-10: qty 10

## 2021-04-10 MED ORDER — CHLORHEXIDINE GLUCONATE 0.12% ORAL RINSE (MEDLINE KIT): 15.0000 mL | Freq: Two times a day (BID) | OROMUCOSAL | Status: DC

## 2021-04-10 MED ORDER — MORPHINE SULFATE (PF) 2 MG/ML IV SOLN
INTRAVENOUS | Status: AC
  Filled 2021-04-10: qty 1

## 2021-04-10 MED ORDER — ORAL CARE MOUTH RINSE: 15.0000 mL | OROMUCOSAL | Status: DC

## 2021-04-10 MED ORDER — POLYETHYLENE GLYCOL 3350 17 G PO PACK: 17.0000 g | PACK | Freq: Every day | ORAL | Status: DC

## 2021-04-11 ENCOUNTER — Ambulatory Visit: Payer: Medicare Other | Admitting: Cardiology

## 2021-04-15 ENCOUNTER — Institutional Professional Consult (permissible substitution): Payer: Medicare Other | Admitting: Internal Medicine

## 2021-04-16 MED FILL — Medication: Qty: 1 | Status: AC

## 2021-04-25 NOTE — Death Summary Note (Signed)
DEATH SUMMARY   Patient Details  Name: Allen Anderson MRN: [id number] DOB: [birth year]  Admission/Discharge Information   Admit Date:  17-Apr-2021  Date of Death: Date of Death: April 18, 2021  Time of Death: Time of Death: 85  Length of Stay: 0  Referring Physician: Garnetta Buddy I, NP    Diagnoses  Recurrent in hospital PEA arrest PAF on [facility] Dementia HTN HLD CAD post CABG Traumatic left finger amputation Recurrent syncope thought possible cardiogenic   Brief Hospital Course (including significant findings, care, treatment, and services provided and events leading to death)  MELBERT BOTELHO is a 87 y.o. male with medical history significant of moderate dementia, PAF with SVR, HTN, HLD, CAD s/p CABG.   Pt tripped and fell at SNF today, fall witnessed by family members.  Described as pt's feet got tangled and he fell injuring L ring finger (amputating tip of it).  Did hit head in fall.   Had been having syncope / pre-syncope episodes previously (being worked up by cards, ? If bradycardia is causing, actually planned to have loop recorder installed shortly).   However, no syncope or LOC with fall today per family who witnessed event, just got legs tangled.  After evaluation in the ED, he was seen by orthopedics and underwent revision of the left ring finger amputation.  He could not be discharged back to his SNF at the late hour and hence he was kept in observation overnight and hospitalist service. Seen and examined this morning. At the time of my evaluation, patient was alert, awake, answer few questions, has baseline dementia.  He was getting ready to be seen by physical therapist. After 3 minutes, a CODE BLUE was called.   Per physical therapist, soon after standing up from his bed to urinate, patient became less responsive, slumped back to the chair and hence CODE BLUE was called. Initially patient had a pulse which he lost within a minute.  CPR was started.  He was given 1 dose  of epinephrine.  ROSC after about 4 minutes of CPR.  No shock needed.  Intubated at site by CRNA. Transferred to critical care service.  He had recurrent PEA cardiac arrest in ICU and unfortunately could not be revived.  Pertinent Labs and Studies  Significant Diagnostic Studies DG Abd 1 View  Result Date: 2021/04/18 CLINICAL DATA:  Nasogastric tube placement. EXAM: ABDOMEN - 1 VIEW COMPARISON:  11/05/2007 FINDINGS: A nasogastric tube is seen with the tip overlying the proximal body of the stomach. No dilated bowel loops seen in the visualized portion of the upper abdomen. Left lower lobe opacity is noted, which may be due to atelectasis,, infiltrate, and/or pleural effusion. IMPRESSION: Nasogastric tube tip overlies the proximal body of the stomach. Electronically Signed   By: Marlaine Hind M.D.   On: 18-Apr-2021 11:03   CT Head Wo Contrast  Result Date: 04/17/21 CLINICAL DATA:  Trauma EXAM: CT HEAD WITHOUT CONTRAST TECHNIQUE: Contiguous axial images were obtained from the base of the skull through the vertex without intravenous contrast. COMPARISON:  10/08/2006 FINDINGS: Brain: No acute intracranial findings are seen. Cortical sulci are prominent. There is decreased density in the periventricular white matter. Vascular: There are scattered arterial calcifications. Skull: Unremarkable. Sinuses/Orbits: Unremarkable. Other: No significant interval changes are noted. IMPRESSION: No acute intracranial findings are seen in noncontrast CT brain. Atrophy. Small-vessel disease. Electronically Signed   By: Elmer Picker M.D.   On: 17-Apr-2021 13:18   CT Cervical Spine Wo Contrast  Result Date:  04/03/2021 CLINICAL DATA:  Neck trauma.  Intoxicated.  Initial encounter. EXAM: CT CERVICAL SPINE WITHOUT CONTRAST TECHNIQUE: Multidetector CT imaging of the cervical spine was performed without intravenous contrast. Multiplanar CT image reconstructions were also generated. COMPARISON:  None. FINDINGS:  Alignment: Normal. Skull base and vertebrae: No acute fracture. No primary bone lesion or focal pathologic process. Soft tissues and spinal canal: No prevertebral fluid or swelling. No visible canal hematoma. Disc levels: Mild-to-moderate degenerative disc disease is seen from levels of C3-C7. Mild to moderate facet DJD is seen on the right side from levels of C C4-T1. Upper chest: Layering pleural effusion is seen in the left lung apex. Other: None. IMPRESSION: No evidence of cervical spine fracture or subluxation. Degenerative cervical spondylosis, as described above. Layering left pleural effusion noted, is of uncertain etiology and clinical significance. Correlation with chest radiograph recommended. Electronically Signed   By: Marlaine Hind M.D.   On: 03/29/2021 13:20   DG Pelvis Portable  Result Date: 04/04/2021 CLINICAL DATA:  [place]. EXAM: PORTABLE PELVIS 1-2 VIEWS COMPARISON:  None. FINDINGS: Both hips are normally located. No acute hip fracture. The pubic symphysis and SI joints are intact. No pelvic fractures or bone lesions. Brachytherapy seeds are noted in the prostate gland. Vascular calcifications. IMPRESSION: No acute bony findings. Electronically Signed   By: Marijo Sanes M.D.   On: 03/29/2021 12:30   DG CHEST PORT 1 VIEW  Result Date: 14-Apr-2021 CLINICAL DATA:  Status post ET tube placement EXAM: PORTABLE CHEST 1 VIEW COMPARISON:  04/20/2021 FINDINGS: The endotracheal tube tip is above the carina. Signs of previous median sternotomy and CABG procedure. Stable cardiomediastinal contours. New ovoid opacity within the projection of the right upper lung measures 3.6 x 1.9 cm. Not seen on radiograph from yesterday. This may reflect fluid trapped within a fissure. Septic emboli may also present as a new nodular opacity. It is unlikely that this represents malignancy. Diminished aeration to the left base compatible with atelectasis. IMPRESSION: 1. Satisfactory placement of endotracheal tube. 2.  Left retrocardiac opacity compatible with atelectasis. 3. New ovoid opacity within the projection of the right upper lung. This may be external to the patient. Alternatively this could represent fluid trapped within the fissure or new focus of inflammation or infection (including septic emboli). It is highly unlikely that this represents a site of malignancy. Attention on follow-up imaging is advised. Electronically Signed   By: Kerby Moors M.D.   On: 2021-04-14 11:08   DG Chest Port 1 View  Result Date: 04/24/2021 CLINICAL DATA:  [place] today. EXAM: PORTABLE CHEST 1 VIEW COMPARISON:  07/21/2018 FINDINGS: The heart is within normal limits in size given the AP projection and portable technique. The mediastinal and hilar contours are within normal limits. Stable surgical changes from bypass surgery. Significant vascular congestion along with interstitial edema and bilateral pleural effusions and bibasilar atelectasis suggesting CHF. No pneumothorax. The bony thorax is intact. IMPRESSION: CHF with bilateral pleural effusions and bibasilar atelectasis. Electronically Signed   By: Marijo Sanes M.D.   On: 04/19/2021 12:28   DG Hand Complete Left  Result Date: 04/12/2021 CLINICAL DATA:  [place].  Left hand laceration. EXAM: LEFT HAND - COMPLETE 3+ VIEW COMPARISON:  None FINDINGS: Soft tissue abnormality involving the distal aspect of the ring finger with an overlying bandage in place. The DIP joint is fused and there is chronic appearing deformity of distal phalanx. No acute fractures are identified. No radiopaque foreign body. Moderate degenerative changes involving the hand and wrist  with chondrocalcinosis noted. IMPRESSION: 1. No acute bony findings or radiopaque foreign body. 2. Chronic appearing fusion of the DIP joint of the ring finger. 3. Moderate degenerative changes and chondrocalcinosis. Electronically Signed   By: Marijo Sanes M.D.   On: 04/01/2021 12:32    Microbiology Recent Results (from the  past 240 hour(s))  Surgical pcr screen     Status: None   Collection Time: 04/08/2021  2:07 PM   Specimen: Nasal Mucosa; Nasal Swab  Result Value Ref Range Status   MRSA, PCR NEGATIVE NEGATIVE Final   Staphylococcus aureus NEGATIVE NEGATIVE Final    Comment: (NOTE) The Xpert SA Assay (FDA approved for NASAL specimens in patients 73 years of age and older), is one component of a comprehensive surveillance program. It is not intended to diagnose infection nor to guide or monitor treatment. Performed at [facility], [place] [address], [place], [place] [address]   Resp Panel by RT-PCR (Flu A&B, Covid) Nasopharyngeal Swab     Status: None   Collection Time: 04/07/2021  2:21 PM   Specimen: Nasopharyngeal Swab; Nasopharyngeal(NP) swabs in vial transport medium  Result Value Ref Range Status   SARS Coronavirus 2 by RT PCR NEGATIVE NEGATIVE Final    Comment: (NOTE) SARS-CoV-2 target nucleic acids are NOT DETECTED.  The SARS-CoV-2 RNA is generally detectable in upper respiratory specimens during the acute phase of infection. The lowest concentration of SARS-CoV-2 viral copies this assay can detect is 138 copies/mL. A negative result does not preclude SARS-Cov-2 infection and should not be used as the sole basis for treatment or other patient management decisions. A negative result may occur with  improper specimen collection/handling, submission of specimen other than nasopharyngeal swab, presence of viral mutation(s) within the areas targeted by this assay, and inadequate number of viral copies(<138 copies/mL). A negative result must be combined with clinical observations, patient history, and epidemiological information. The expected result is Negative.  Fact Sheet for Patients:  EntrepreneurPulse.com.au  Fact Sheet for Healthcare Providers:  IncredibleEmployment.be  This test is no t yet approved or cleared by the [place] FDA and  has been  authorized for detection and/or diagnosis of SARS-CoV-2 by FDA under an Emergency Use Authorization (EUA). This EUA will remain  in effect (meaning this test can be used) for the duration of the COVID-19 declaration under Section 564(b)(1) of the Act, 21 U.S.C.section 360bbb-3(b)(1), unless the authorization is terminated  or revoked sooner.       Influenza A by PCR NEGATIVE NEGATIVE Final   Influenza B by PCR NEGATIVE NEGATIVE Final    Comment: (NOTE) The Xpert Xpress SARS-CoV-2/FLU/RSV plus assay is intended as an aid in the diagnosis of influenza from Nasopharyngeal swab specimens and should not be used as a sole basis for treatment. Nasal washings and aspirates are unacceptable for Xpert Xpress SARS-CoV-2/FLU/RSV testing.  Fact Sheet for Patients: EntrepreneurPulse.com.au  Fact Sheet for Healthcare Providers: IncredibleEmployment.be  This test is not yet approved or cleared by the [place] FDA and has been authorized for detection and/or diagnosis of SARS-CoV-2 by FDA under an Emergency Use Authorization (EUA). This EUA will remain in effect (meaning this test can be used) for the duration of the COVID-19 declaration under Section 564(b)(1) of the Act, 21 U.S.C. section 360bbb-3(b)(1), unless the authorization is terminated or revoked.  Performed at [facility], [place] [address] [place], [place] [address]     Lab Basic Metabolic Panel: Recent Labs  Lab 04/05/2021 1206 04-15-2021 [phone]  04-27-2021 1111  NA 137 138 136  K 4.1 4.0 4.6  CL 100 99  --   CO2 26 22  --   GLUCOSE 106* 144*  --   BUN 40* 41*  --   CREATININE 1.43* 1.69*  --   CALCIUM 9.4 9.4  --    Liver Function Tests: No results for input(s): AST, ALT, ALKPHOS, BILITOT, PROT, ALBUMIN in the last 168 hours. No results for input(s): LIPASE, AMYLASE in the last 168 hours. No results for input(s): AMMONIA in the last 168 hours. CBC: Recent Labs  Lab  03/30/2021 1206 Apr 27, 2021 0326 April 27, 2021 1111  WBC 6.3 8.6  --   NEUTROABS 4.3  --   --   HGB 11.7* 11.6* 11.2*  HCT 37.7* 36.5* 33.0*  MCV 93.8 92.2  --   PLT 148* 170  --    Cardiac Enzymes: No results for input(s): CKTOTAL, CKMB, CKMBINDEX, TROPONINI in the last 168 hours. Sepsis Labs: Recent Labs  Lab 04/16/2021 1206 Apr 27, 2021 0326  WBC 6.3 8.6    Candee Furbish 04/15/2021, 12:00 PM

## 2021-04-25 NOTE — Progress Notes (Signed)
PT Cancellation Note  Patient Details Name: Allen Anderson MRN: [id number] DOB: 01/14/1929   Cancelled Treatment:    Reason Eval/Treat Not Completed: Medical issues which prohibited therapy.  Pt has been intubated, will check at another time to see if medically appropriate.   Ramond Dial 05-02-2021, 10:49 AM  Mee Hives, PT PhD Acute Rehab Dept. Number: [place]

## 2021-04-25 NOTE — Progress Notes (Signed)
RT to bedside at 1100 for code blue. ABG drawn per MD request and pt bagged at 100% FiO2 with a peep valve at 12cmH2O. Pt extubated at 1125 per Verbal order from Dr. Tamala Julian CCM MD at patients bedside.

## 2021-04-25 NOTE — Progress Notes (Signed)
Notified by nursing secretary that she had received a call from someone in patient's room reporting that patient was unresponsive. On my way to patient's room, Code Blue was called. Upon entering with other staff members, patient was noted with no pulse and no respirations. Emergency interventions then started. Pt regain responsiveness after interventions and was later transferred to MICU for a higher level of care. During transfer, this Probation officer received a call from patient's daughter, Allen Anderson, who reported that she had received several calls from the hospital and was wondering who had call and for what. This writer advised to give daughter a call within 10 minutes. Upon returning call, there was no response. A voice message was left by this Probation officer. Attempting calling spouse, Allen Anderson; but no answer. Unable to leave a voice message.

## 2021-04-25 NOTE — Progress Notes (Signed)
CODE BLUE/PROGRESS NOTE  Allen Anderson  DOB: 12/12/1928  PCP: Garnetta Buddy I, NP [id number]  DOA: 04/08/2021  LOS: 0 days  Hospital Day: 2  Chief Complaint  Patient presents with   Fall    Brief narrative: Allen Anderson is a 87 y.o. male with PMH significant for dementia, DM2, HTN, HLD, CAD/PAD/carotid stenosis, osteoarthritis, recurrent syncopal episodes who lives in [facility]. On 11/15, patient had a witnessed mechanical fall at the nursing home leading to amputation of the tip of his left ring finger and was subsequently brought to the ED. After evaluation in the ED, he was seen by orthopedics and underwent revision of the left ring finger amputation.  He could not be discharged back to his SNF at the late hour and hence he was kept in observation overnight and hospitalist service. Seen and examined this morning. At the time of my evaluation, patient was alert, awake, answer few questions, has baseline dementia.  He was getting ready to be seen by physical therapist. After 3 minutes, a CODE BLUE was called.  Per physical therapist, soon after standing up from his bed to urinate, patient became less responsive, slumped back to the chair and hence CODE BLUE was called. Initially patient had a pulse which he lost within a minute.  CPR was started.  He was given 1 dose of epinephrine.  ROSC after about 4 minutes of CPR.  No shock needed.  Intubated at site by CRNA. Transferred to critical care service.  Subjective: Patient was seen and examined this morning prior to and during the code.  See above.  Assessment/Plan: Cardiac arrest -Had a syncopal episode this morning while working with therapy soon after which patient became pulseless. -ROSC achieved after about 4 minutes of CPR, 1 dose of epinephrine, did not require shock. -Intubated at site by CRNA. -Transferred to PCCM.  History of syncope -Per patient's daughter and previous charts, patient has been having  episodes of presyncope/syncope.  He has permanent A. fib and bradycardia.  He was seen by cardiologist Dr. Stanford Breed in the office on 10/26.  Office note states on need of implantable loop recorder placement as well as an EP evaluation. -May benefit from loop recorder placement during this hospitalization.  Permanent A. Fib Chronic bradycardia -Not on AV nodal blocking agent -On Eliquis for anticoagulation.  Needs a discussion about stopping it because of recurrent falls.  Traumatic amputation of finger -Despite history of syncope, the fall he had on 11/15 at the SNF was witnessed to be a mechanical fall after his feet got tangled.  He ended up having a traumatic amputation of left ring finger.  Amputation was revised by orthopedics Dr. Fredna Dow. -Continue pain control. -Follow up 1 week with Dr. Fredna Dow in office  Acute metabolic encephalopathy Dementia -Delirium on dementia probably related to pain meds, anesthesia -Mental status had improved by the time of my evaluation before the court this morning. -CT head on admission was negative.  Chronic diastolic CHF Essential hypertension -Home meds include Lasix 40 mg daily, losartan 50 mg twice daily -Continue losartan.  I would keep Lasix on hold because of recurrent syncope. -Last echo from August 2022 with EF 55%, moderate MR. -As needed hydralazine.  CAD/PAD/carotid stenosis HLD -Was on statin and anticoagulation.  Continue statin.  Eliquis on hold.  CKD 3a  -chronic and stable Recent Labs    10/16/20 1336 10/23/20 1046 12/03/20 1546 04/19/2021 1206 04/23/21 0326  BUN 34* 33* 31* 40* 41*  CREATININE 1.64* 1.59* 1.56* 1.43* 1.69*   Mobility: PT eval Living condition: [facility] nursing home Goals of care:   Code Status: Full Code  Nutritional status: Body mass index is 25.98 kg/m.     Diet:  Diet Order             Diet NPO time specified  Diet effective now                  DVT prophylaxis:  SCD's Start: 04/19/2021  2211 SCDs Start: 04/06/2021 1939   Antimicrobials: None Fluid: LR at 25 mill per hour Consultants: PCCM Family Communication: Called and updated patient's daughter Ms. Lucinda Dell this morning  Status is: Observation  Remains inpatient appropriate because: CODE BLUE this morning, intubated, transferred to ICU  Dispo: The patient is from: Nursing home              Anticipated d/c is to: Nursing home              Patient currently is not medically stable to d/c.   Difficult to place patient No     Infusions:   lactated ringers 75 mL/hr at 04-22-21 0253   norepinephrine      Scheduled Meds:  atorvastatin  20 mg Oral QHS   atropine       chlorhexidine gluconate (MEDLINE KIT)  15 mL Mouth Rinse BID   [START ON 04/11/2021] Chlorhexidine Gluconate Cloth  6 each Topical Daily   cholecalciferol  2,000 Units Oral Daily   docusate  100 mg Per Tube BID   EPINEPHrine NaCl       hydrOXYzine  25 mg Oral QHS   losartan  50 mg Oral BID   mouth rinse  15 mL Mouth Rinse 10 times per day   memantine  5 mg Oral QHS   multivitamin with minerals  1 tablet Oral Daily   [START ON 04/11/2021] polyethylene glycol  17 g Per Tube Daily   vitamin B-12  500 mcg Oral Daily   zinc sulfate  220 mg Oral Daily    PRN meds: acetaminophen **OR** acetaminophen, fentaNYL (SUBLIMAZE) injection, fentaNYL (SUBLIMAZE) injection, hydrALAZINE, ondansetron **OR** ondansetron (ZOFRAN) IV   Antimicrobials: Anti-infectives (From admission, onward)    Start     Dose/Rate Route Frequency Ordered Stop   04/03/2021 1630  ceFAZolin (ANCEF) IVPB 2g/100 mL premix        2 g 200 mL/hr over 30 Minutes Intravenous On call to O.R. 03/30/2021 1617 03/26/2021 1800   04/04/2021 1612  ceFAZolin (ANCEF) 2-4 GM/100ML-% IVPB       Note to Pharmacy: Roosvelt Maser   : cabinet override      03/26/2021 1612 04/08/2021 1809       Objective: Vitals:   04-22-21 0017 2021/04/22 1020  BP: (!) 102/52 129/85  Pulse: 60 81  Resp: 16 (!) 24   Temp: 98 F (36.7 C)   SpO2: 90% 94%    Intake/Output Summary (Last 24 hours) at 22-Apr-2021 1113 Last data filed at 2021-04-22 0649 Gross per 24 hour  Intake 0 ml  Output --  Net 0 ml   Filed Weights   04/18/2021 1202  Weight: 79.8 kg   Weight change:  Body mass index is 25.98 kg/m.   Physical Exam prior to the code: General exam: Pleasant, elderly Caucasian male.  Was not in distress Skin: No rashes, lesions or ulcers. HEENT: Atraumatic, normocephalic, no obvious bleeding Lungs: Clear to auscultation bilaterally CVS: Bradycardia, irregular rhythm, pansystolic murmur  GI/Abd soft, nontender, nondistended, bowel sound present CNS: Alert, awake, able to answer simple questions, baseline dementia Psychiatry: Sad affect Extremities: No pedal edema, no calf tenderness.  Left ring finger has a bandage on after amputation yesterday.  Data Review: I have personally reviewed the laboratory data and studies available.  F/u labs ordered Unresulted Labs (From admission, onward)     Start     Ordered   05-05-2021 1036  Blood gas, arterial  ONCE - STAT,   STAT        05-May-2021 1035            Signed, Terrilee Croak, MD [facility] May 05, 2021

## 2021-04-25 NOTE — Progress Notes (Signed)
Spoke with medical examiner. He stated to take patient down to morgue with everything in tact and he will notify if patient is a medical examiner case.

## 2021-04-25 NOTE — Progress Notes (Addendum)
Repeated attempts to keep oxygen on- keeps pulling it off-also repeatedly removing teley leads

## 2021-04-25 NOTE — Evaluation (Signed)
Occupational Therapy Evaluation Patient Details Name: Allen Anderson MRN: [id number] DOB: 30-Sep-1928 Today's Date: 04-26-2021   History of Present Illness 87 y.o. male with medical history significant of moderate dementia, PAF with SVR, HTN, HLD, CAD s/p CABG.     Pt tripped and fell at SNF injuring L ring finger (amputating tip of it).  Underwent Left ring finger amputation.   Clinical Impression   Patient seen for OT eval this date due to diagnosis and procedure above.  Patient was transferred to the recliner, stated he was urinating, OT had a pad under him and was waiting for him to finish.  OT then had the patient stand with Min A to change the soaked pad, the patient began to have difficulty maintaining standing, and was assisted to sitting, ? syncopal.  The patient was sitting at the edge of the recliner, and OT asked if he could scoot back, the patient responded No, and began to slump forward and shake.  OT immediately sat him back against the back of the recliner, the patient looked at the OT and closed his eyes.  OT began to call his name, brought his feet up, and attempted a sternal rub.  The patient did not respond, the OT checked for a heart beat, and could not feel any, so the code blue was pulled by this OT.  The patient was noted shaking, ? Seizure, and began urinating, and at this time, the nursing staff and code team arrived.       Recommendations for follow up therapy are one component of a multi-disciplinary discharge planning process, led by the attending physician.  Recommendations may be updated based on patient status, additional functional criteria and insurance authorization.   Follow Up Recommendations  No OT follow up    Assistance Recommended at Discharge None  Functional Status Assessment     Equipment Recommendations  None recommended by OT    Recommendations for Other Services       Precautions / Restrictions Precautions Precautions:  Fall Restrictions Weight Bearing Restrictions: No      Mobility Bed Mobility Overal bed mobility: Needs Assistance Bed Mobility: Supine to Sit     Supine to sit: Min guard       Patient Response: Flat affect  Transfers Overall transfer level: Needs assistance Equipment used: Rolling walker (2 wheels) Transfers: Sit to/from Stand;Bed to chair/wheelchair/BSC Sit to Stand: Min assist     Step pivot transfers: Min assist            Balance Overall balance assessment: Needs assistance Sitting-balance support: Feet supported Sitting balance-Leahy Scale: Fair     Standing balance support: Bilateral upper extremity supported;Reliant on assistive device for balance Standing balance-Leahy Scale: Poor                             ADL either performed or assessed with clinical judgement   ADL               Lower Body Bathing: Moderate assistance;Sit to/from stand       Lower Body Dressing: Moderate assistance;Sit to/from stand   Toilet Transfer: Moderate assistance;Stand-pivot                   Vision Patient Visual Report: No change from baseline       Perception Perception Perception: Not tested   Praxis Praxis Praxis: Not tested    Pertinent Vitals/Pain Pain Assessment: No/denies pain Faces  Pain Scale: No hurt Pain Intervention(s): Monitored during session     Hand Dominance     Extremity/Trunk Assessment Upper Extremity Assessment Upper Extremity Assessment: Overall WFL for tasks assessed;LUE deficits/detail LUE Deficits / Details: ring finger bandaged   Lower Extremity Assessment Lower Extremity Assessment: Defer to PT evaluation   Cervical / Trunk Assessment Cervical / Trunk Assessment: Kyphotic   Communication Communication Communication: HOH   Cognition Arousal/Alertness: Awake/alert Behavior During Therapy: Flat affect Overall Cognitive Status: History of cognitive impairments - at baseline                                  General Comments: difficulty with one step commands     General Comments       Exercises     Shoulder Instructions      Home Living Family/patient expects to be discharged to:: Skilled nursing facility                                        Prior Functioning/Environment Prior Level of Function : Needs assist             Mobility Comments: Patient poor historian, assume wheelchair for long distances. ADLs Comments: Assume assist as needed.        OT Problem List: Decreased activity tolerance;Impaired balance (sitting and/or standing)      OT Treatment/Interventions:      OT Goals(Current goals can be found in the care plan section) Acute Rehab OT Goals OT Goal Formulation: Patient unable to participate in goal setting Potential to Achieve Goals: Poor ADL Goals Pt Will Perform Grooming: with set-up;sitting Pt Will Perform Upper Body Bathing: with set-up;sitting Pt Will Perform Upper Body Dressing: with min guard assist;sitting Pt Will Transfer to Toilet: with min assist;stand pivot transfer;bedside commode Pt Will Perform Toileting - Clothing Manipulation and hygiene: with min assist;sit to/from stand  OT Frequency:     Barriers to D/C:            Co-evaluation              AM-PAC OT "6 Clicks" Daily Activity     Outcome Measure Help from another person eating meals?: A Little Help from another person taking care of personal grooming?: A Little Help from another person toileting, which includes using toliet, bedpan, or urinal?: A Lot Help from another person bathing (including washing, rinsing, drying)?: A Lot Help from another person to put on and taking off regular upper body clothing?: A Little Help from another person to put on and taking off regular lower body clothing?: A Lot 6 Click Score: 15   End of Session Equipment Utilized During Treatment: Gait belt;Rolling walker (2 wheels)  Activity  Tolerance: Patient tolerated treatment well Patient left: Other (comment) (Code Blue called, patient left with Code team)  OT Visit Diagnosis: Unsteadiness on feet (R26.81)                Time: 7353-2992 OT Time Calculation (min): 19 min Charges:  OT General Charges $OT Visit: 1 Visit OT Evaluation $OT Eval Moderate Complexity: 1 Mod  04/13/21  RP, OTR/L  Acute Rehabilitation Services  Office:  [phone]   Metta Clines 13-Apr-2021, 12:17 PM

## 2021-04-25 NOTE — Consult Note (Signed)
NAME:  Allen Anderson, MRN:  [id number], DOB:  Jun 20, 1928, LOS: 0 ADMISSION DATE:  03/28/2021, CONSULTATION DATE: 04-25-21 REFERRING MD: [facility], CHIEF COMPLAINT: Post cardiac arrest  History of Present Illness:  87 year old male skilled nursing facility resident with known dementia hard of hearing he got his feet tangled up her underwear and fell and had a traumatic amputation of left index finger.  Transferred to [facility] for surgical revision.  On 04-25-21 he had a witness arrest with a syncopal episode prior he required CPR epinephrine and intubation on the floor.  Transferred to the intensive care unit and subsequently suffered 2 more cardiac arrest that proved refractory to transcutaneous pacing, epinephrine, atropine, bicarb, calcium chloride, and continued CPR.  The code was called by Dr. Erskine Emery family was notified of demise.  Pertinent  Medical History   Past Medical History:  Diagnosis Date   Carotid stenosis    10/2008: R 0-39% L 40-59%     07/2009: L ICA < 50%  L ECA high-grade  R mild plaque   Coronary artery disease    cath may 2008; stable   Diabetes mellitus type 2, diet-controlled (HCC)    Hearing loss    History of colonic polyps    History of prostate cancer    Hyperlipidemia    Hypertension    Memory change    Osteoarthritis    PVD (peripheral vascular disease) ([place])    Tubular adenoma of colon    history of      Significant Hospital Events: Including procedures, antibiotic start and stop dates in addition to other pertinent events   04/10/2020 multiple cardiac arrest with subsequent death.  Interim History / Subjective:  Transferred to [facility] intensive care unit subsequently cardiac arrest improved refractory to epinephrine and atropine fluids transcutaneous pacing subsequently code was called for Dr. Tamala Julian  Objective   Blood pressure 129/85, pulse 81, temperature 98 F (36.7 C), temperature source Oral, resp. rate (!) 24, height 5\' 9"  (1.753 m),  weight 79.8 kg, SpO2 94 %.    Vent Mode: PRVC FiO2 (%):  [70 %] 70 % Set Rate:  [18 bmp] 18 bmp Vt Set:  [560 mL] 560 mL PEEP:  [8 cmH20] 8 cmH20   Intake/Output Summary (Last 24 hours) at 04/25/21 1047 Last data filed at 25-Apr-2021 3716 Gross per 24 hour  Intake 0 ml  Output --  Net 0 ml   Filed Weights   03/29/2021 1202  Weight: 79.8 kg    Examination: General: Frail elderly male on arrival was moving extremities subsequently coded x2 code was called and he was pronounced Resolved Hospital Problem list     Assessment & Plan:  Status post cardiac arrest with PE presume bradycardic event with a known history of syncope.  Occurred on 2021-04-25 at approximately 10:00 AM.  He required 2 separate rounds of CPR 1 epi and intubation by anesthesia.  Currently hemodynamically stable but not follow commands at this time please note he is got a history of dementia and is extremely hard of hearing. Transferred to the intensive care unit Vent bundle Gentle sedation Evaluate for extubation and needed future He will need a cardiology consult for ongoing issues with syncope and possible permanent pacemaker placement.  Status post traumatic amputation of left ring finger with subsequent surgical treatment 04/10/2063 by Dr. Fredna Dow orthopedics orthopedics her number   Note: After arrival on the intensive care unit subsequently had 2 more cardiac arrest with improved refractory to epinephrine chest  compressions.  After repeated attempts to restart his heart the code was called by Dr. [facility] (right click and "Reselect all SmartList Selections" daily)   Diet/type: NPO DVT prophylaxis:  GI prophylaxis: PPI Lines: N/A Foley:  N/A Code Status:  full code Last date of multidisciplinary goals of care discussion [tbd] 04-16-2021 daughter was updated via phone.  The daughter's name is Lucinda Dell and her phone number is [phone] Labs   CBC: Recent Labs  Lab  04/15/2021 1206 April 16, 2021 0326  WBC 6.3 8.6  NEUTROABS 4.3  --   HGB 11.7* 11.6*  HCT 37.7* 36.5*  MCV 93.8 92.2  PLT 148* 810    Basic Metabolic Panel: Recent Labs  Lab 04/16/2021 1206 04-16-2021 0326  NA 137 138  K 4.1 4.0  CL 100 99  CO2 26 22  GLUCOSE 106* 144*  BUN 40* 41*  CREATININE 1.43* 1.69*  CALCIUM 9.4 9.4   GFR: Estimated Creatinine Clearance: 29.1 mL/min (A) (by C-G formula based on SCr of 1.69 mg/dL (H)). Recent Labs  Lab 04/08/2021 1206 16-Apr-2021 0326  WBC 6.3 8.6    Liver Function Tests: No results for input(s): AST, ALT, ALKPHOS, BILITOT, PROT, ALBUMIN in the last 168 hours. No results for input(s): LIPASE, AMYLASE in the last 168 hours. No results for input(s): AMMONIA in the last 168 hours.  ABG No results found for: PHART, PCO2ART, PO2ART, HCO3, TCO2, ACIDBASEDEF, O2SAT   Coagulation Profile: No results for input(s): INR, PROTIME in the last 168 hours.  Cardiac Enzymes: No results for input(s): CKTOTAL, CKMB, CKMBINDEX, TROPONINI in the last 168 hours.  HbA1C: Hgb A1c MFr Bld  Date/Time Value Ref Range Status  10/05/2007 08:25 AM 6.6 (H) 4.6 - 6.0 % Final    Comment:    See lab report for associated comment(s)  05/12/2007 03:24 PM 6.5 (H) 4.6 - 6.0 % Final    Comment:    See lab report for associated comment(s)    CBG: Recent Labs  Lab 04/08/2021 1854 Apr 16, 2021 0959  GLUCAP 113* 148*    Review of Systems:   Na   Past Medical History:  He,  has a past medical history of Carotid stenosis, Coronary artery disease, Diabetes mellitus type 2, diet-controlled ([place]), Hearing loss, History of colonic polyps, History of prostate cancer, Hyperlipidemia, Hypertension, Memory change, Osteoarthritis, PVD (peripheral vascular disease) ([place]), and Tubular adenoma of colon.   Surgical History:   Past Surgical History:  Procedure Laterality Date   AMPUTATION Left 04/04/2021   Procedure: REVISION AMPUTATION LEFT RING FINGER;  Surgeon: Leanora Cover,  MD;  Location: [facility];  Service: Orthopedics;  Laterality: Left;   CARDIOVERSION N/A 01/03/2019   Procedure: CARDIOVERSION;  Surgeon: Sanda Klein, MD;  Location: [facility];  Service: Cardiovascular;  Laterality: N/A;   [place]  2008     Social History:   reports that he has quit smoking. He has never used smokeless tobacco. He reports that he does not drink alcohol and does not use drugs.   Family History:  His family history includes Breast cancer in his sister; Congestive Heart Failure in his father; Diabetes in his cousin and another family member; Heart disease in his father and other family members; Prostate cancer in an other family member; Stomach cancer in his cousin. There is no history of Colon cancer.   Allergies Allergies  Allergen Reactions   Beta Adrenergic Blockers Other (See  Comments)    bradycardia     Home Medications  Prior to Admission medications   Medication Sig Start Date End Date Taking? Authorizing Provider  atorvastatin (LIPITOR) 20 MG tablet TAKE 1 TABLET DAILY AT 6 P.M. Patient taking differently: Take 20 mg by mouth at bedtime. 07/02/20  Yes Lelon Perla, MD  Cholecalciferol (VITAMIN D3) 50 MCG (2000 UT) TABS Take 2,000 Units by mouth daily.   Yes [provider]  ELIQUIS 5 MG TABS tablet Take 5 mg by mouth 2 (two) times daily. 03/25/21  Yes [provider]  furosemide (LASIX) 20 MG tablet Take 2 tablets (40 mg total) by mouth daily. 12/03/20 06/01/21 Yes Crenshaw, Denice Bors, MD  Ginkgo Biloba 120 MG CAPS Take 120 mg by mouth at bedtime.   Yes [provider]  Glucos-MSM-C-Mn-Ginger-Willow (GLUCOSAMINE MSM COMPLEX PO) Take 1 tablet by mouth daily.   Yes [provider]  hydrOXYzine (VISTARIL) 25 MG capsule Take 25 mg by mouth at bedtime. 04/05/21  Yes [provider]  losartan (COZAAR) 50 MG tablet Take 50 mg by mouth 2 (two) times daily. 04/04/21   Yes [provider]  memantine (NAMENDA) 5 MG tablet Take 5 mg by mouth at bedtime. 03/27/21  Yes [provider]  Multiple Vitamin (MULTIVITAMIN) tablet Take 1 tablet by mouth daily.   Yes [provider]  traMADol (ULTRAM) 50 MG tablet 1 tab PO q6 hours prn pain 04/18/2021  Yes Leanora Cover, MD  vitamin B-12 (CYANOCOBALAMIN) 500 MCG tablet Take 500 mcg by mouth daily.   Yes [provider]  zinc gluconate 50 MG tablet Take 50 mg by mouth daily.   Yes [provider]     Critical care time: 45 min   Richardson Landry Eldene Plocher ACNP Acute Care Nurse Practitioner [place] Please consult Amion 05-10-2021, 10:47 AM

## 2021-04-25 NOTE — Progress Notes (Signed)
Patient arrived on unit at 1030 via rapid response nurse. Pt was intubated and unstable.  At 1101 pt PEA arrested and CPR was started. See code sheet for details. At 1120 TOD was called. Dr. Tamala Julian called daughter [name] to notify of passing.

## 2021-04-25 NOTE — Progress Notes (Signed)
   2021/05/09 1300  Clinical Encounter Type  Visited With Health care provider  Visit Type Death  Referral From Chaplain  Consult/Referral To Chaplain   Chaplain Jorene Guest followed up with the unit to see if additional support was needed. There are not family present at this time. Chaplain Baker Janus remains available for follow-up spiritual and emotional support as needed. This note was prepared by Jeanine Luz, M.Div..  For questions please contact by phone [phone].

## 2021-04-25 NOTE — Anesthesia Procedure Notes (Signed)
Procedure Name: Intubation Date/Time: 2021/04/22 10:04 AM Performed by: Renato Shin, CRNA Pre-anesthesia Checklist: Patient identified, Emergency Drugs available, Suction available and Patient being monitored Oxygen Delivery Method: Ambu bag Preoxygenation: Pre-oxygenation with 100% oxygen Ventilation: Mask ventilation without difficulty Laryngoscope Size: Miller and 3 Grade View: Grade I Tube size: 7.5 mm Number of attempts: 1 Airway Equipment and Method: Stylet Placement Confirmation: positive ETCO2 and breath sounds checked- equal and bilateral Tube secured with: Tape Dental Injury: Teeth and Oropharynx as per pre-operative assessment

## 2021-04-25 NOTE — TOC CAGE-AID Note (Signed)
Transition of Care [facility]) - CAGE-AID Screening   Patient Details  Name: Allen Anderson MRN: [id number] Date of Birth: [birth year]  Transition of Care [facility]) CM/SW Contact:    Trenia Tennyson C Tarpley-Carter, [place] Phone Number: 05-May-2021, 11:01 AM   Clinical Narrative: Pt is unable to participate in Cage Aid.  Pt is inappropriate for assessment.  Drakkar Medeiros Tarpley-Carter, MSW, LCSW-A Pronouns:  She/Her/Hers [facility] HealthTransitions of Care Clinical Social Worker Direct Number:  [phone] [email or web address]  CAGE-AID Screening: Substance Abuse Screening unable to be completed due to: : Patient unable to participate (Pt is inappropriate for assessment.)             Substance Abuse Education Offered: No

## 2021-04-25 NOTE — Progress Notes (Signed)
   04/25/21 1030  Clinical Encounter Type  Visited With Health care provider;Family  Visit Type Initial;Code;Critical Care;Social support   [facility] responded to CODE BLUE on 5N; when [facility] arrived medical team were attending to pt.  5N secretary attempted to make contact w/family numerous times but was not able to get through.  Pt. transferred to 62M after regaining cardiac rhythm; [facility] followed pt. to ICU and reattempted family contact --> got in touch w/pt.'s dtr. [name].  [name] shared she is in the process of trying to arrange for surgical procedure for her mother and this is what she was doing when team attempted to call her earlier; [facility] passed phone to ICU NP who gave update re: pt.'s current condition; resumed conversation w/dtr. after update and dtr. shared that pt. has been having episodes where "he stands up and his heart rate and blood pressure go down and he faints" for some time now.  Dtr. shared that pt. and his wife moved from [place], [place] to a facility in [place], [place] earlier this past October; dtr. lives outside [place] and is planning on attempting to come see pt. today.  Chaplains remain available for further support as needed.  Lindaann Pascal, Chaplain Pager: [phone]

## 2021-04-25 DEATH — deceased

## 2021-06-28 ENCOUNTER — Ambulatory Visit: Payer: Medicare Other | Admitting: Cardiology

## 2022-07-30 IMAGING — CT CT HEAD W/O CM
4 series · 17 of 47 positions shown, 19 images · non-contrast
Comparison: 10/08/2006

CLINICAL DATA: Trauma

EXAM:
CT HEAD WITHOUT CONTRAST
TECHNIQUE: Contiguous axial images were obtained from the base of the skull
through the vertex without intravenous contrast.

[Series 3: head without · axial · non-contrast · 0.45mm/px · z∈[-92,+28]mm · 7 of 34 slices shown, 9 images]
[im 5/34  brain]
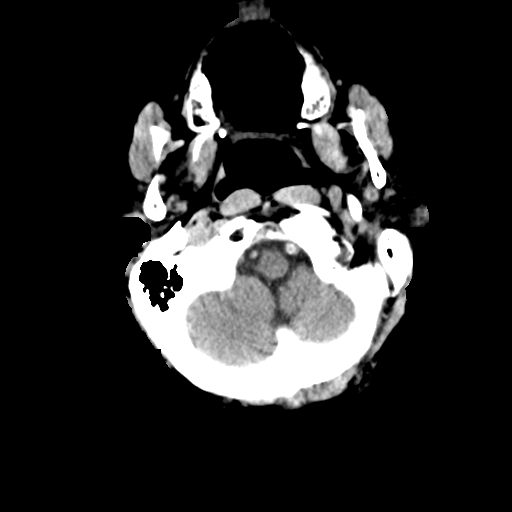
[im 5/34  bone]
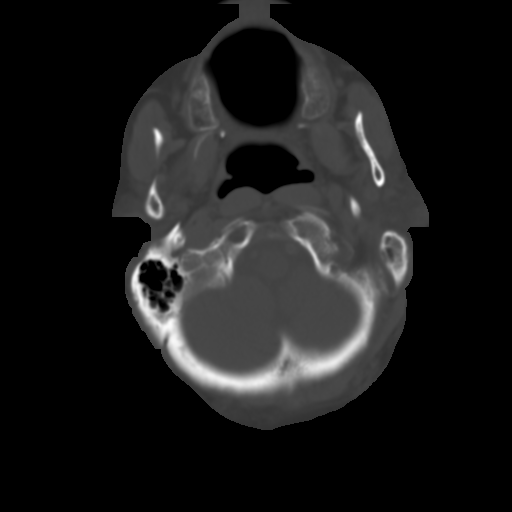
[im 9/34  brain]
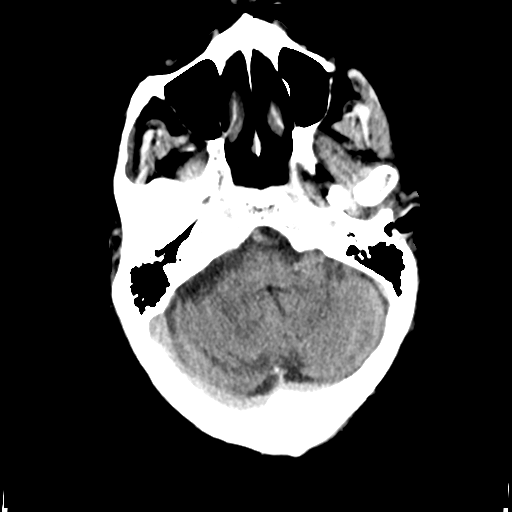
[im 13/34  brain]
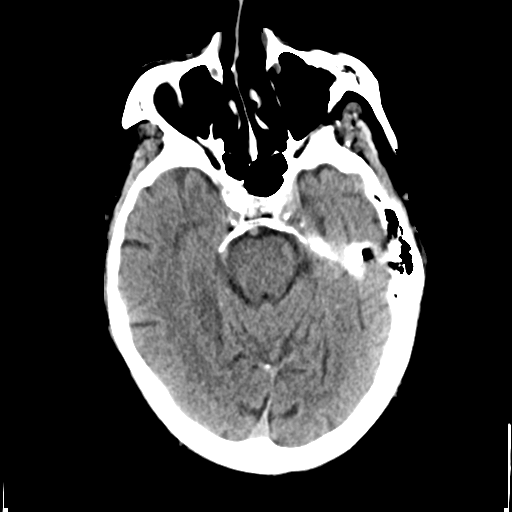
[im 17/34  brain]
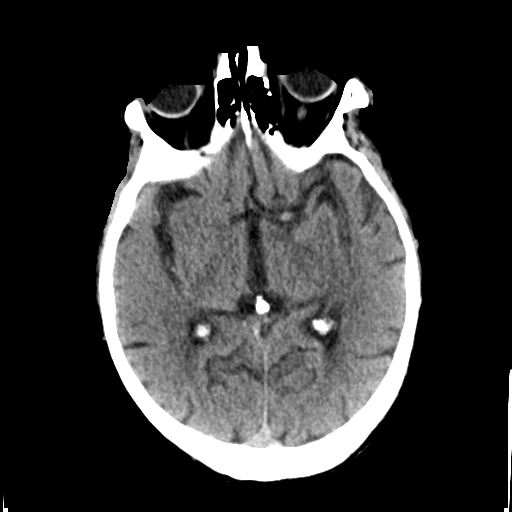
[im 21/34  brain]
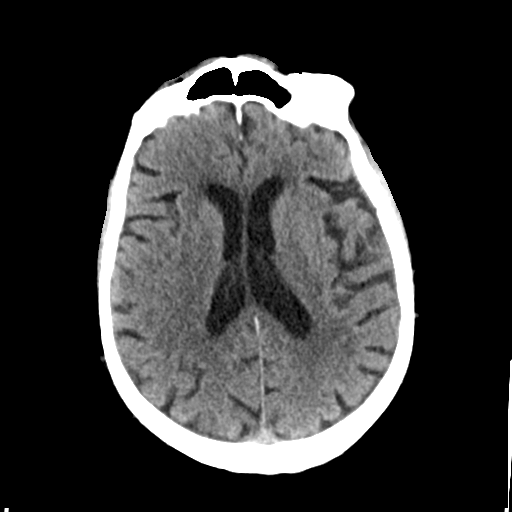
[im 21/34  bone]
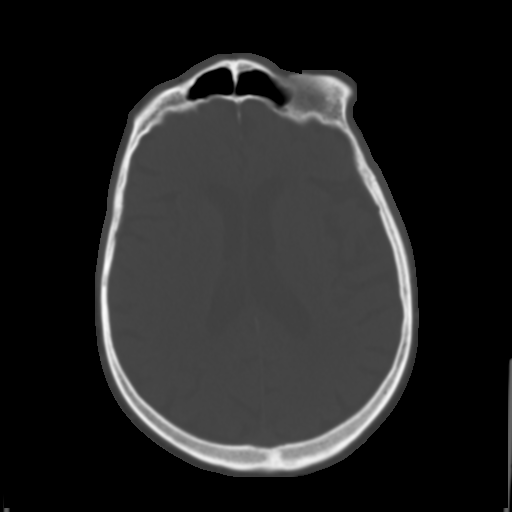
[im 25/34  brain]
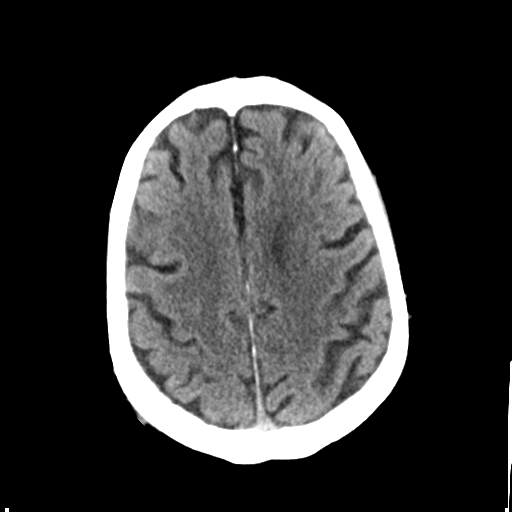
[im 29/34  brain]
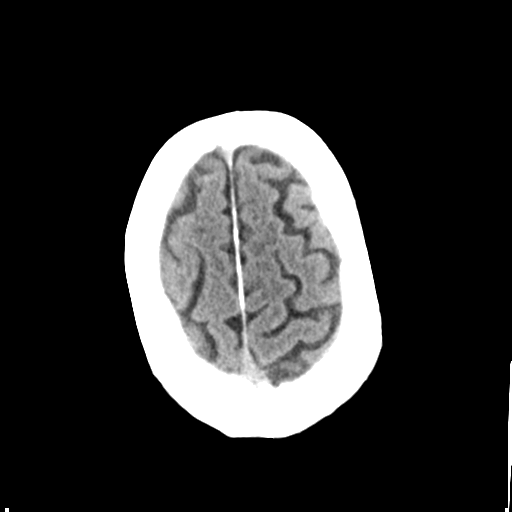

[Series 4: head bone · axial · 0.45mm/px · z∈[-96,-38]mm · 4 of 85 slices shown]
[im 9/85  bone]
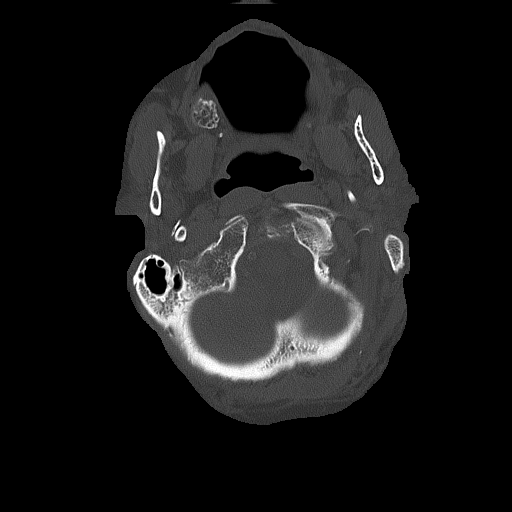
[im 17/85  bone]
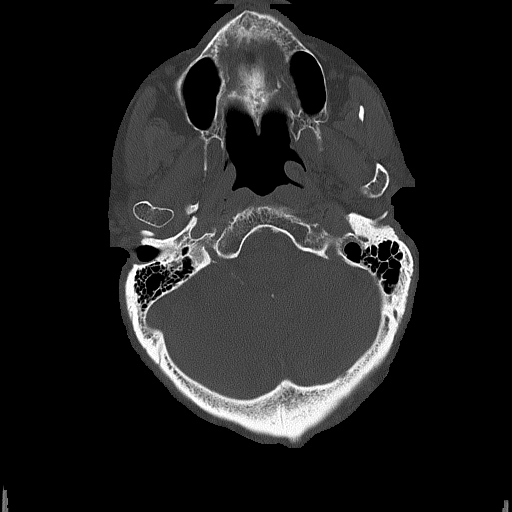
[im 26/85  bone]
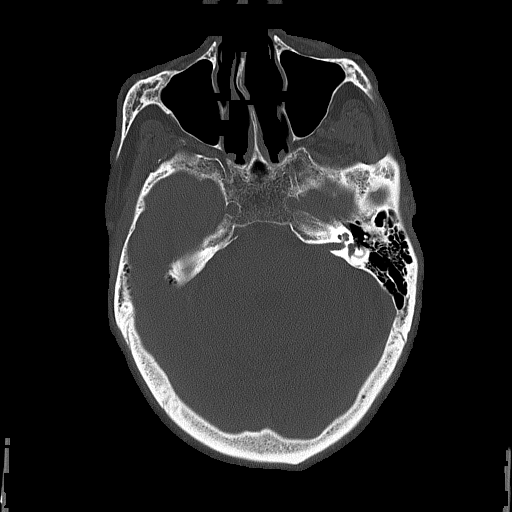
[im 38/85  bone]
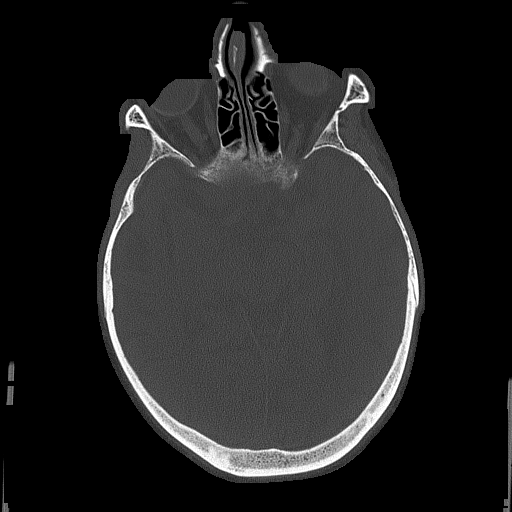

[Series 5: head without cor · coronal · non-contrast · 0.35mm/px · 3 of 73 slices shown]
[im 25/73  brain]
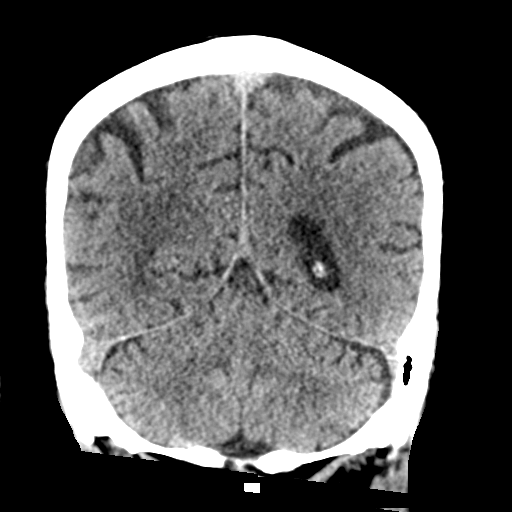
[im 33/73  brain]
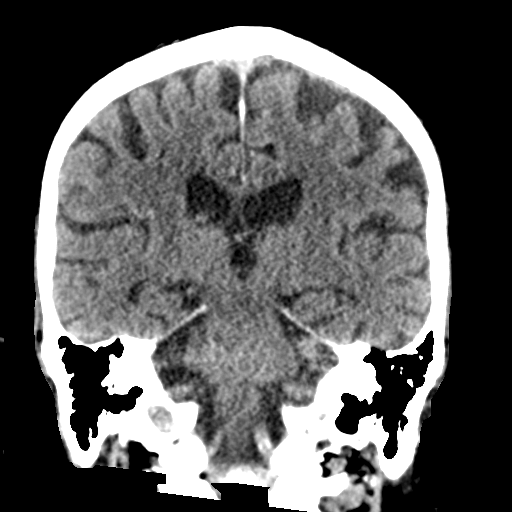
[im 41/73  brain]
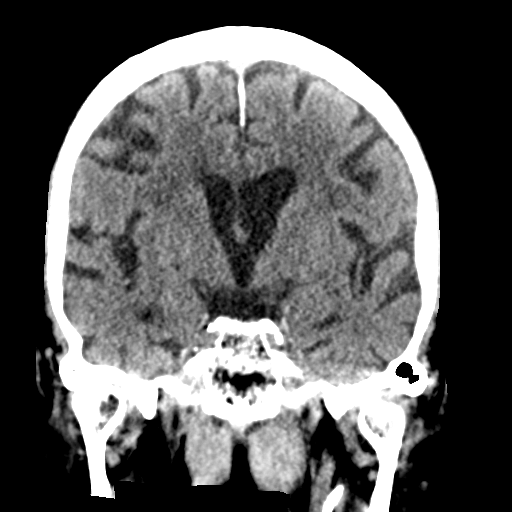

[Series 6: head without sag · sagittal · non-contrast · 0.34mm/px · 3 of 54 slices shown]
[im 18/54  brain]
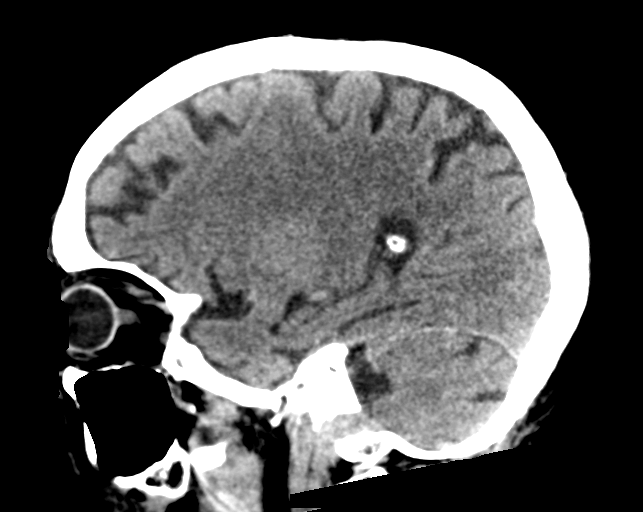
[im 27/54  brain]
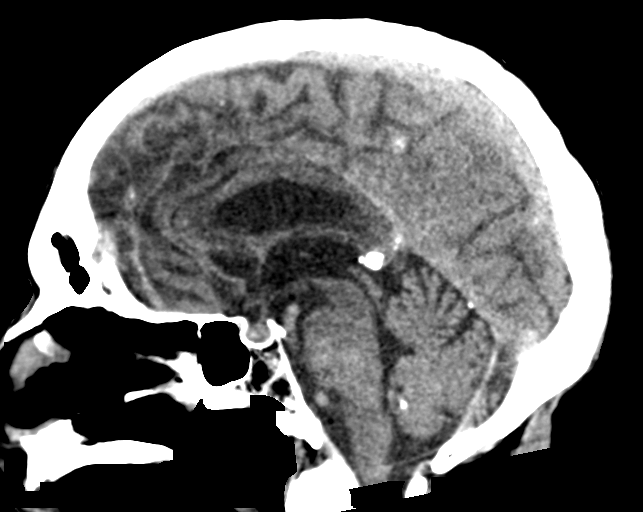
[im 36/54  brain]
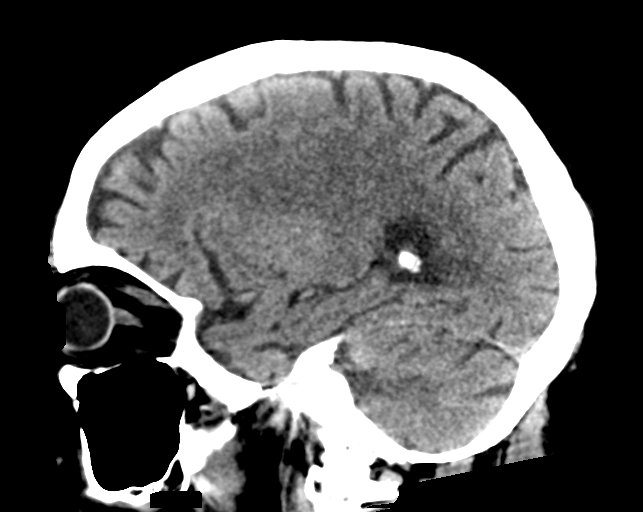

[17 of 47 positions shown; findings below may reference images not displayed]

FINDINGS: Brain: No acute intracranial findings are seen. Cortical sulci are
prominent. There is decreased density in the periventricular white
matter.

Vascular: There are scattered arterial calcifications.

Skull: Unremarkable.

Sinuses/Orbits: Unremarkable.

Other: No significant interval changes are noted.
IMPRESSION: No acute intracranial findings are seen in noncontrast CT brain.
Atrophy. Small-vessel disease.

## 2022-07-31 IMAGING — DX DG CHEST 1V PORT
1 series · 1 of 1 positions shown · non-contrast
Comparison: 04/09/2021

CLINICAL DATA: Status post ET tube placement

EXAM:
PORTABLE CHEST 1 VIEW

[chest ap]
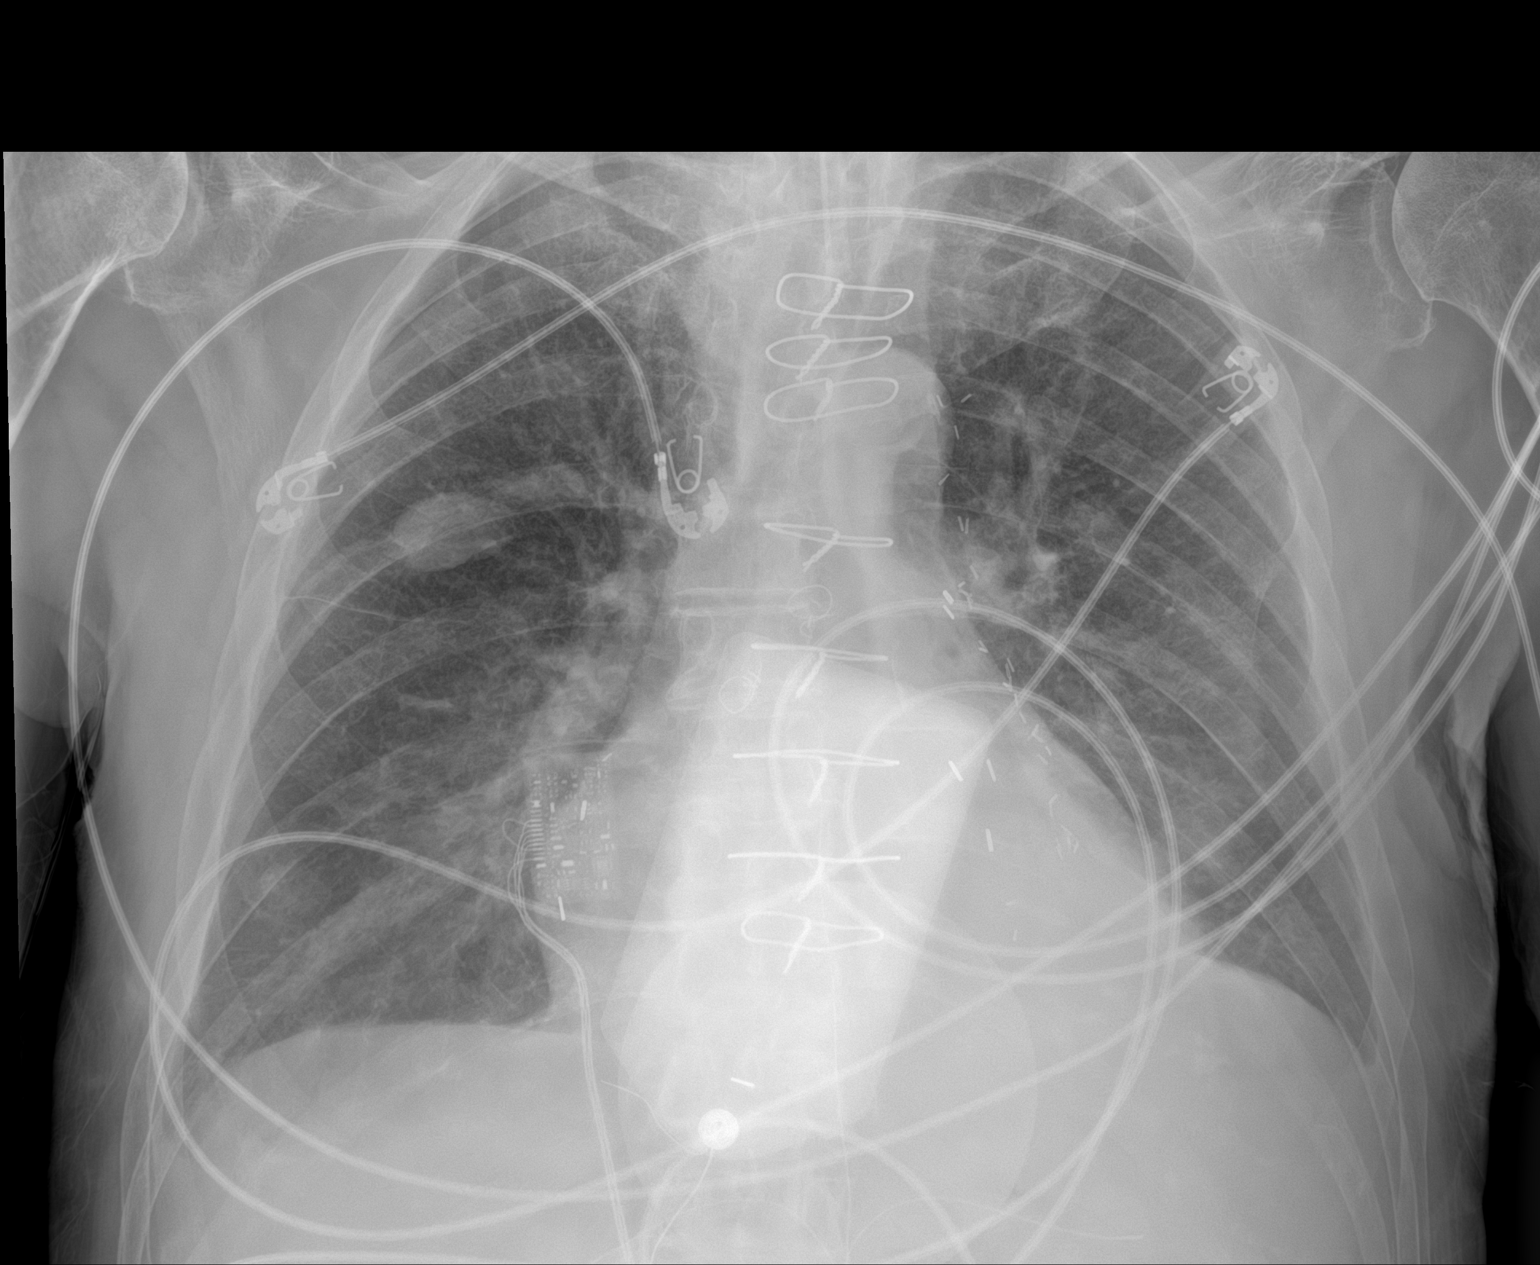

[1 of 1 positions shown; findings below may reference images not displayed]

FINDINGS: The endotracheal tube tip is above the carina. Signs of previous
median sternotomy and CABG procedure. Stable cardiomediastinal
contours. New ovoid opacity within the projection of the right upper
lung measures 3.6 x 1.9 cm. Not seen on radiograph from yesterday.
This may reflect fluid trapped within a fissure. Septic emboli may
also present as a new nodular opacity. It is unlikely that this
represents malignancy. Diminished aeration to the left base
compatible with atelectasis.
IMPRESSION: 1. Satisfactory placement of endotracheal tube.
2. Left retrocardiac opacity compatible with atelectasis.
3. New ovoid opacity within the projection of the right upper lung.
This may be external to the patient. Alternatively this could
represent fluid trapped within the fissure or new focus of
inflammation or infection (including septic emboli). It is highly
unlikely that this represents a site of malignancy. Attention on
follow-up imaging is advised.

## 2022-07-31 IMAGING — DX DG ABDOMEN 1V
1 series · 1 of 1 positions shown · non-contrast
Comparison: 11/05/2007

CLINICAL DATA: Nasogastric tube placement.

EXAM:
ABDOMEN - 1 VIEW

[abdomen supine]
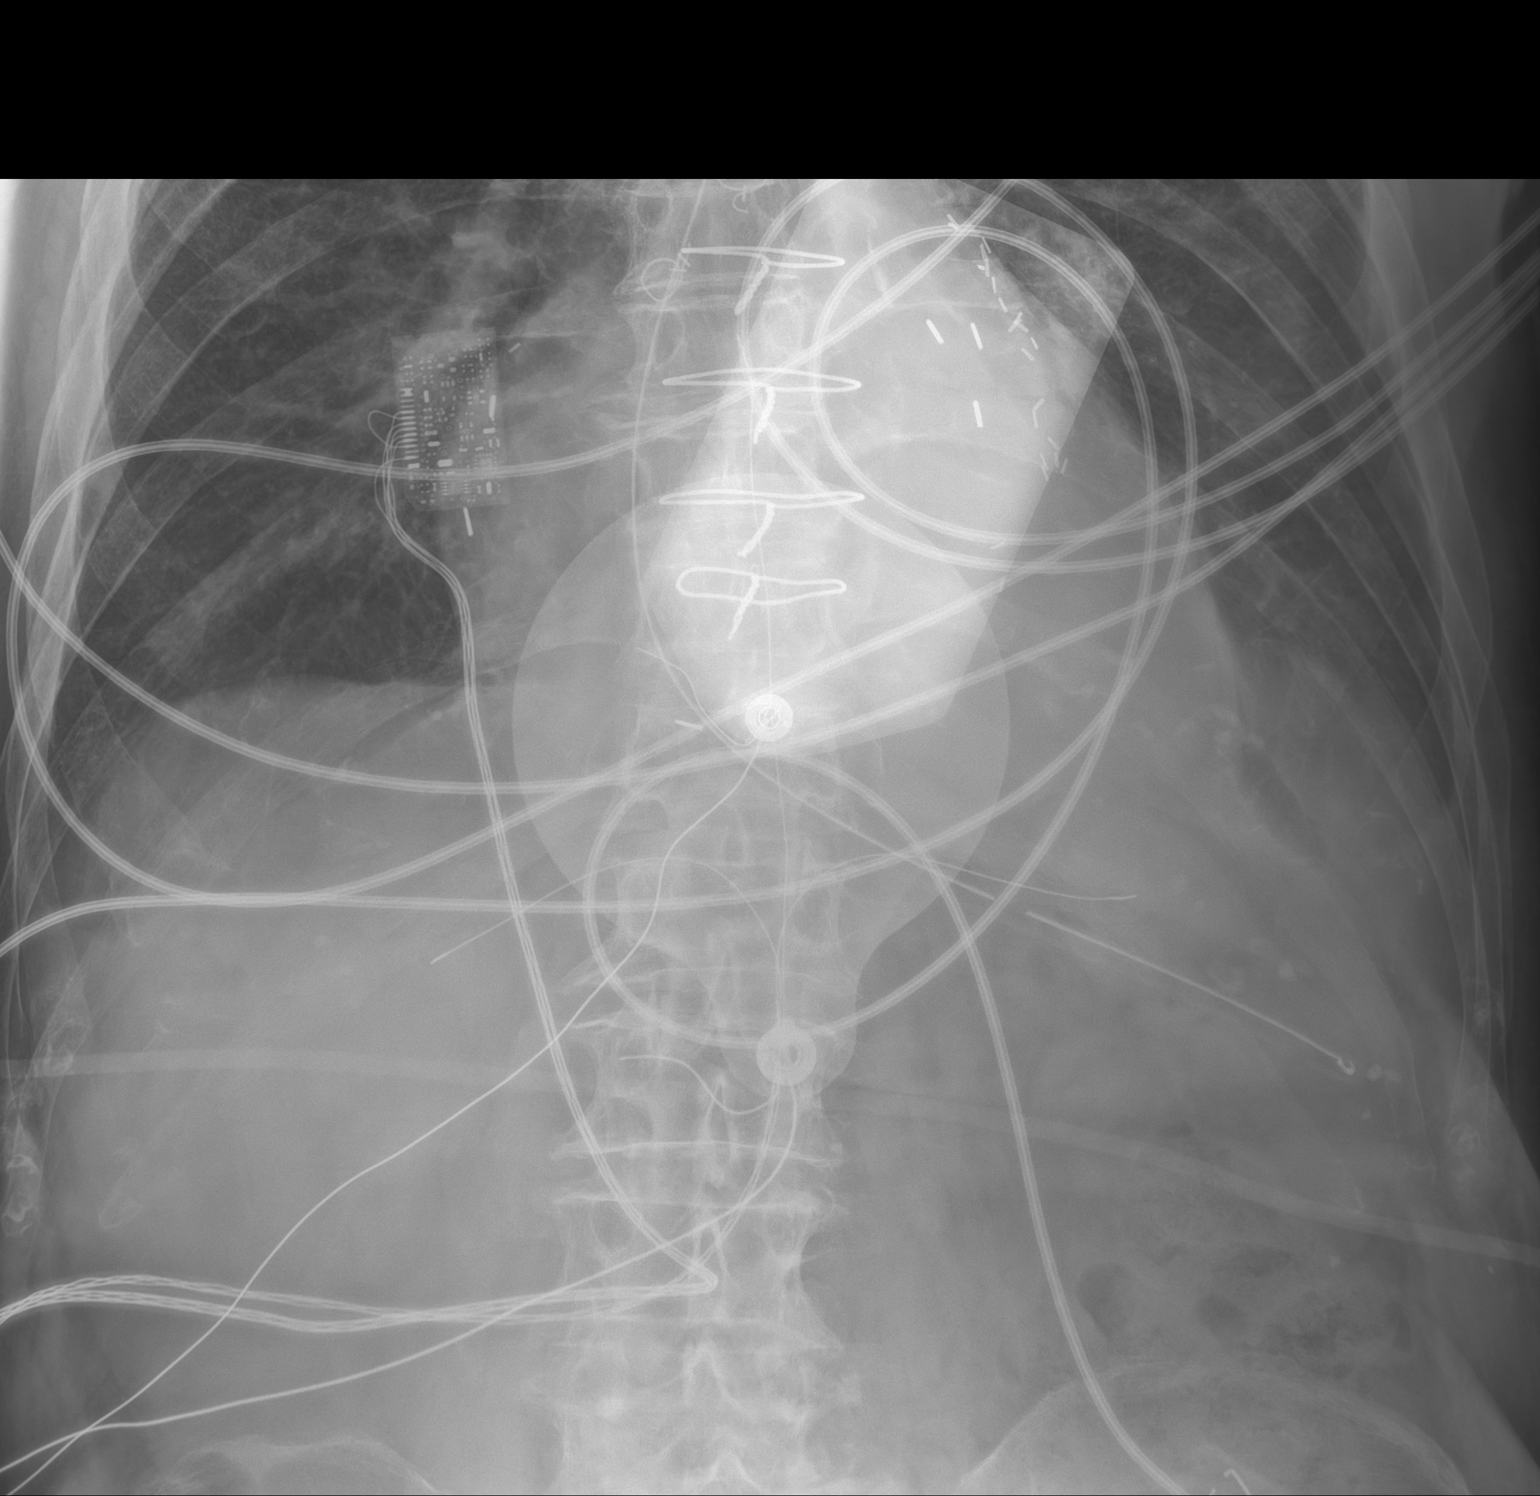

[1 of 1 positions shown; findings below may reference images not displayed]

FINDINGS: A nasogastric tube is seen with the tip overlying the proximal body
of the stomach. No dilated bowel loops seen in the visualized
portion of the upper abdomen. Left lower lobe opacity is noted,
which may be due to atelectasis,, infiltrate, and/or pleural
effusion.
IMPRESSION: Nasogastric tube tip overlies the proximal body of the stomach.
